# Patient Record
Sex: Female | Born: 1980 | Race: White | Hispanic: No | Marital: Married | Smoking: Never smoker
Health system: Southern US, Community
[De-identification: ages and names within clinical notes are randomized; demographics above are authoritative.]

## PROBLEM LIST (undated history)

## (undated) DIAGNOSIS — I10 Essential (primary) hypertension: Secondary | ICD-10-CM

## (undated) DIAGNOSIS — M1990 Unspecified osteoarthritis, unspecified site: Secondary | ICD-10-CM

## (undated) DIAGNOSIS — S058X1A Other injuries of right eye and orbit, initial encounter: Secondary | ICD-10-CM

## (undated) DIAGNOSIS — C801 Malignant (primary) neoplasm, unspecified: Secondary | ICD-10-CM

## (undated) DIAGNOSIS — E119 Type 2 diabetes mellitus without complications: Secondary | ICD-10-CM

## (undated) DIAGNOSIS — E785 Hyperlipidemia, unspecified: Secondary | ICD-10-CM

## (undated) DIAGNOSIS — R7303 Prediabetes: Secondary | ICD-10-CM

## (undated) HISTORY — PX: TONSILLECTOMY: SUR1361

## (undated) HISTORY — DX: Essential (primary) hypertension: I10

## (undated) HISTORY — DX: Type 2 diabetes mellitus without complications: E11.9

## (undated) HISTORY — PX: DILATION AND CURETTAGE OF UTERUS: SHX78

## (undated) HISTORY — PX: WISDOM TOOTH EXTRACTION: SHX21

## (undated) HISTORY — DX: Hyperlipidemia, unspecified: E78.5

---

## 2002-05-31 ENCOUNTER — Other Ambulatory Visit: Admission: RE | Admit: 2002-05-31 | Discharge: 2002-05-31 | Payer: Self-pay | Admitting: Obstetrics and Gynecology

## 2003-08-17 ENCOUNTER — Observation Stay (HOSPITAL_COMMUNITY): Admission: EM | Admit: 2003-08-17 | Discharge: 2003-08-18 | Payer: Self-pay | Admitting: *Deleted

## 2003-08-22 ENCOUNTER — Encounter: Admission: RE | Admit: 2003-08-22 | Discharge: 2003-08-22 | Payer: Self-pay | Admitting: Family Medicine

## 2006-07-10 ENCOUNTER — Inpatient Hospital Stay (HOSPITAL_COMMUNITY): Admission: AD | Admit: 2006-07-10 | Discharge: 2006-07-10 | Payer: Self-pay | Admitting: Obstetrics & Gynecology

## 2006-07-12 ENCOUNTER — Inpatient Hospital Stay (HOSPITAL_COMMUNITY): Admission: AD | Admit: 2006-07-12 | Discharge: 2006-07-12 | Payer: Self-pay | Admitting: Gynecology

## 2006-07-19 ENCOUNTER — Inpatient Hospital Stay (HOSPITAL_COMMUNITY): Admission: AD | Admit: 2006-07-19 | Discharge: 2006-07-19 | Payer: Self-pay | Admitting: Obstetrics & Gynecology

## 2008-05-17 IMAGING — US US OB COMP LESS 14 WK
1 series · 14 of 28 positions shown · non-contrast
Comparison: none

CLINICAL DATA: 7 weeks, spotting.  
 OBSTETRICAL ULTRASOUND <14 WKS AND TRANSVAGINAL OB US:
TECHNIQUE: Both transabdominal and transvaginal ultrasound examinations were performed for complete evaluation of the gestation as well as the maternal uterus, adnexal regions, and pelvic cul-de-sac.

[Series 1: us ob comp less 14 wks · 14 of 29 slices shown]
[im 2/29]
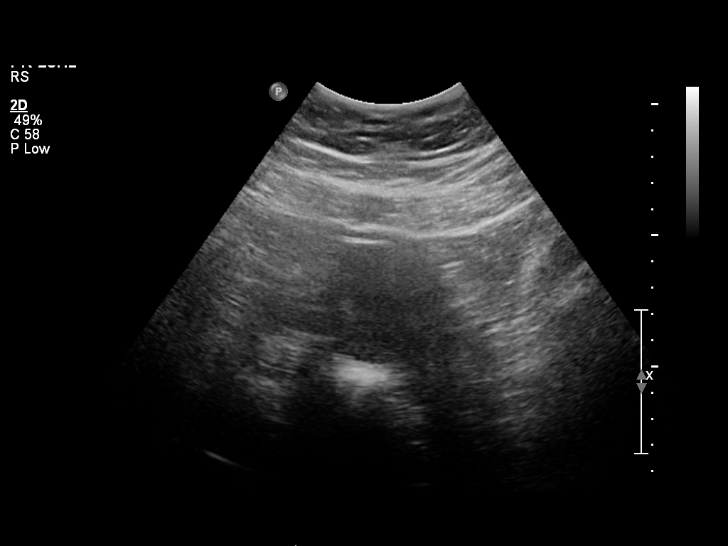
[im 4/29]
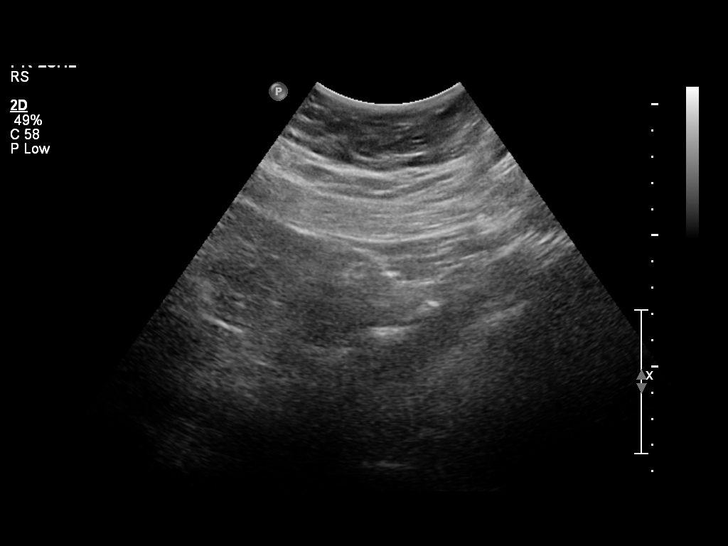
[im 6/29]
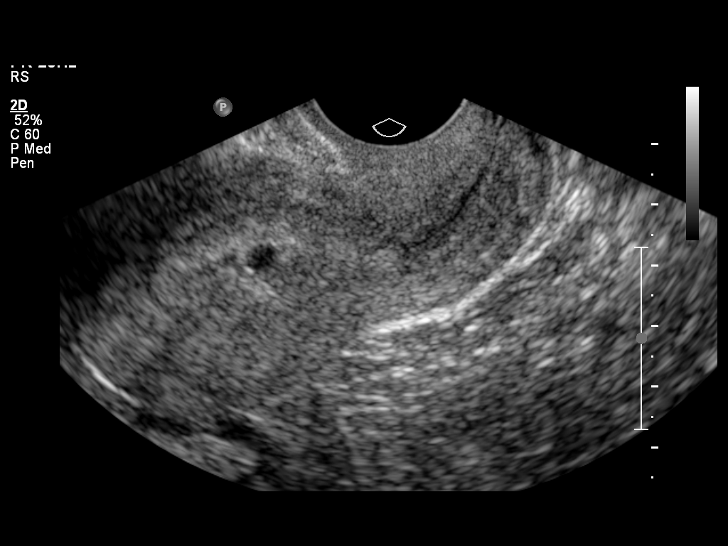
[im 8/29]
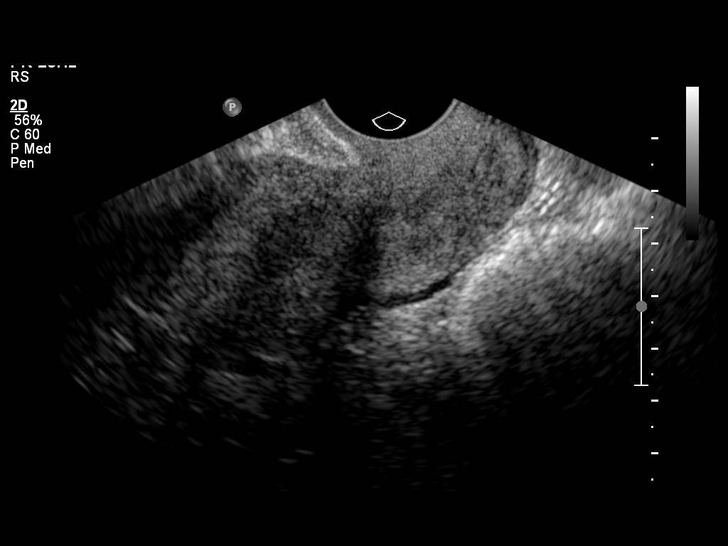
[im 10/29]
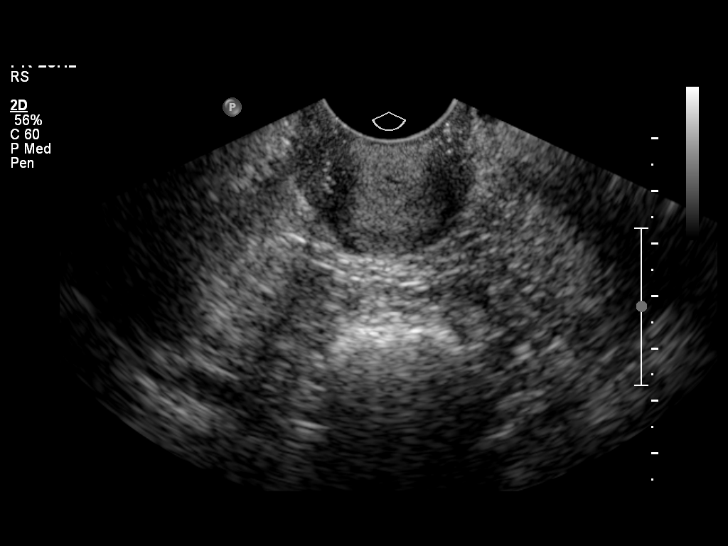
[im 12/29]
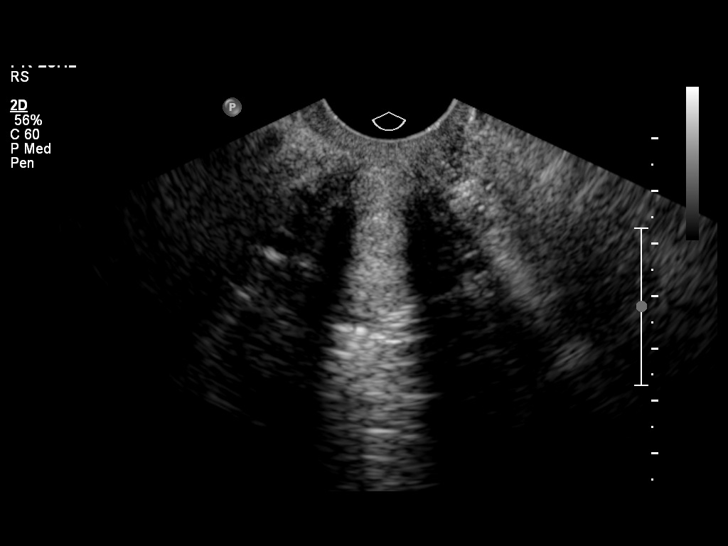
[im 14/29]
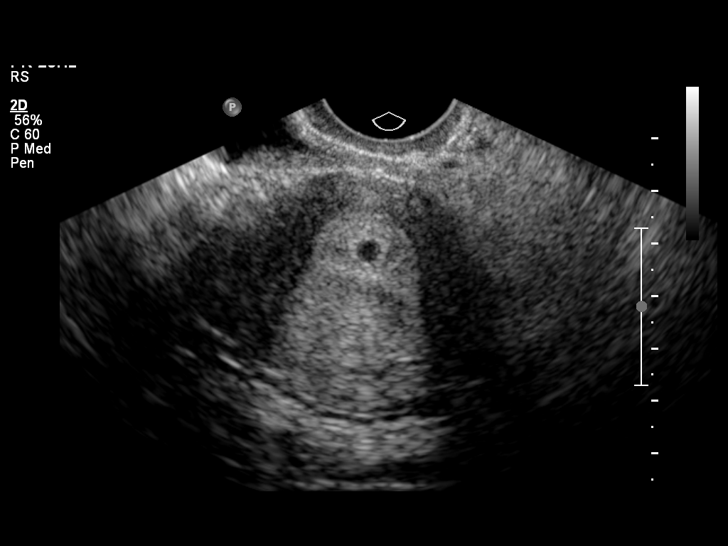
[im 16/29]
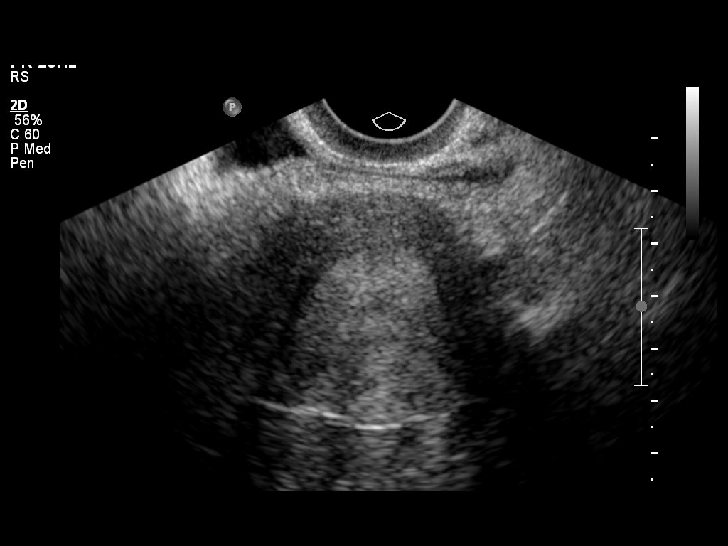
[im 18/29]
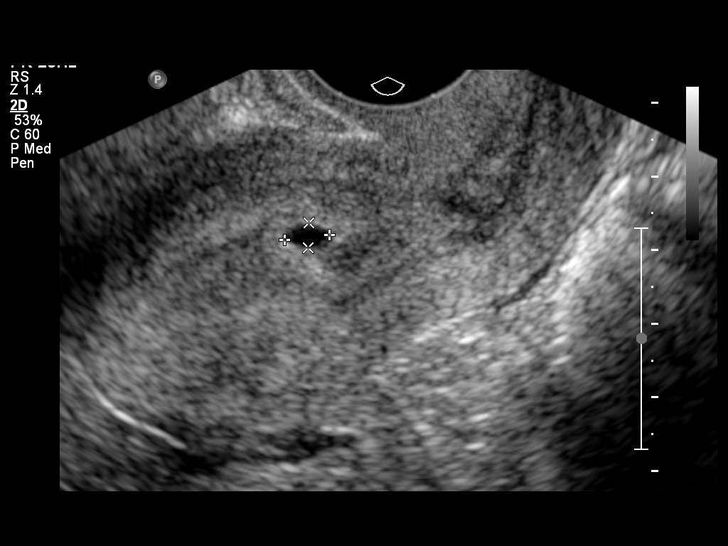
[im 20/29]
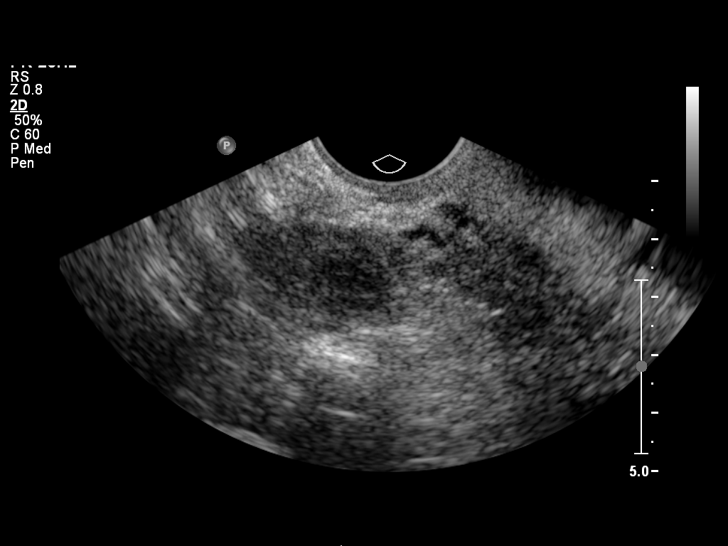
[im 22/29]
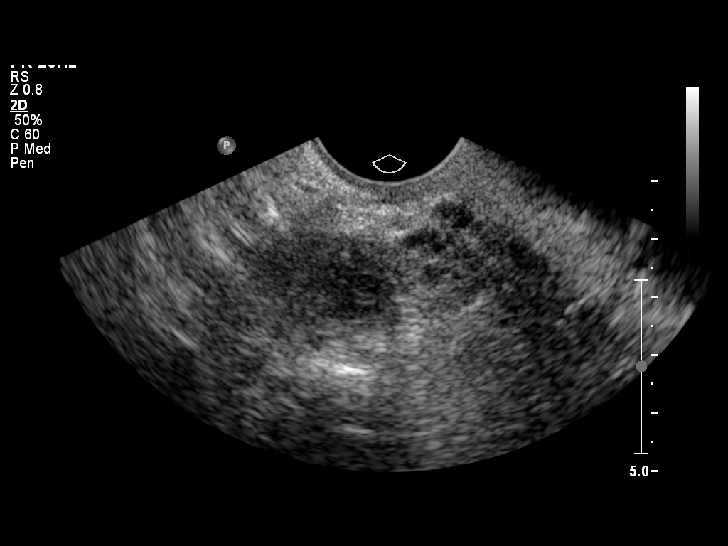
[im 24/29]
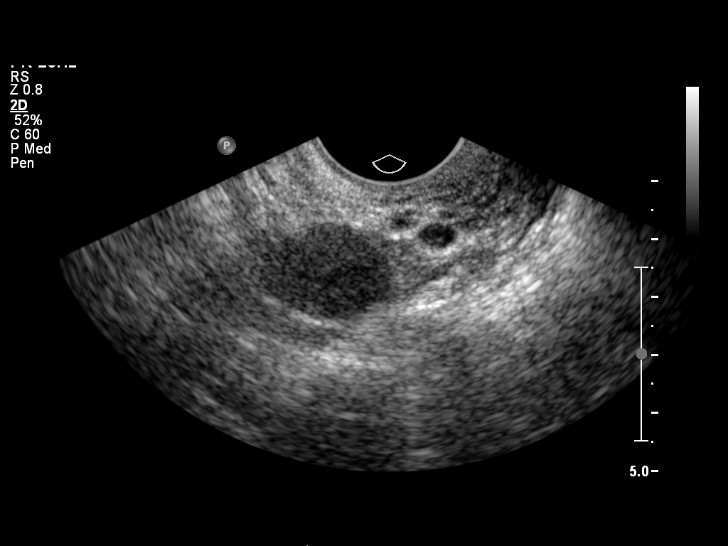
[im 26/29]
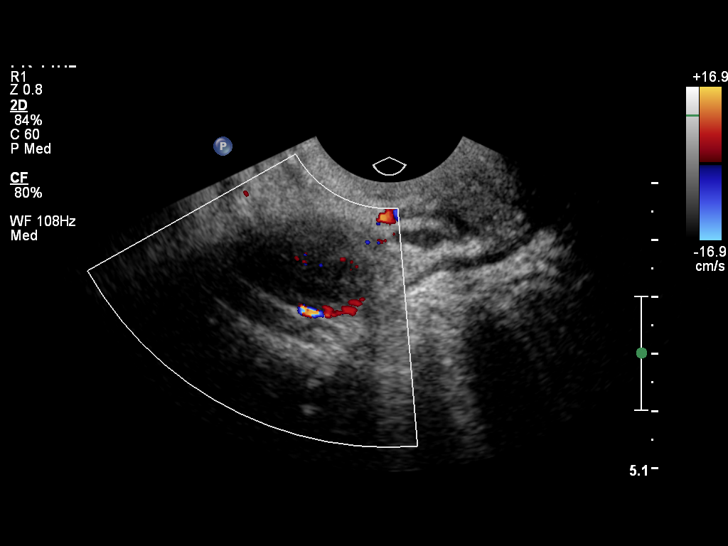
[im 29/29]
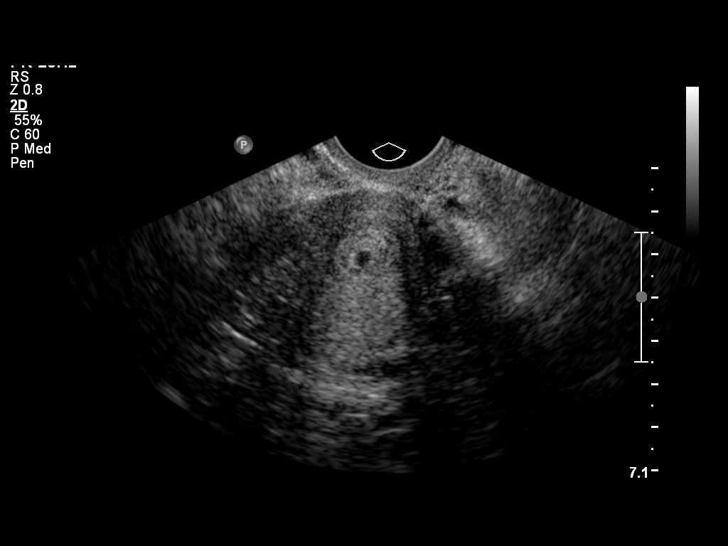

[14 of 28 positions shown; findings below may reference images not displayed]

FINDINGS: Single intrauterine gestational sac with mean sac diameter of 0.48 cm corresponding to 5-week gestation.  No yolk sac or embryo is detected at this time.  No evidence of cardiac activity.  Right ovarian corpus luteum cyst.  Left ovary cannot be visualized.  No free pelvic fluid.
IMPRESSION: Single 5-week intrauterine gestational sac.  No evidence of fetal pole or yolk sac at this time.  Consider follow-up ultrasound pending clinical setting.

## 2010-02-08 ENCOUNTER — Encounter: Payer: Self-pay | Admitting: Obstetrics & Gynecology

## 2010-06-05 NOTE — H&P (Signed)
NAME:  Valerie Aguilar, Valerie Aguilar                     ACCOUNT NO.:  [id number]   MEDICAL RECORD NO.:  [id number]                   PATIENT TYPE:  EMS   LOCATION:  [facility]                                 FACILITY:  [facility]   PHYSICIAN:  Leighton Roach McDiarmid, M.D.             DATE OF BIRTH:  16-Apr-1980   DATE OF ADMISSION:  08/17/2003  DATE OF DISCHARGE:                                HISTORY & PHYSICAL   PRIMARY CARE PHYSICIAN:  [facility].   CHIEF COMPLAINT:  Sore throat and shortness of breath.   HISTORY OF PRESENT ILLNESS:  The patient is a 30 year old white female who  is a patient of [facility] who developed a rash last Friday which  was causing her some significant itching at which time she went to see her  PCP who gave her a steroids shot.  She went home over the weekend,  unfortunately developed a sore throat, returned to the clinic at which time  she was given Augmentin XR 1000 mg to take two tabs b.i.d.  On Wednesday,  she was not feeling better and her throat was still bothering her.  Her  antibiotics were changed to Avelox and she was given another steroid shot  for swelling tonsils.  At that time, her PCP also contacted an ENT physician  who recommended putting her on a tapered dose of methylprednisolone.  The  Avelox unfortunately did not work.  The patient began to feel worse on  Thursday, so she restarted the Augmentin and was taking the oral  methylprednisolone inconsistently as it caused her to have some perceived  tachycardia and upset stomach.  Over the course of the next couple of days  the patient had difficulty sleeping and also had some mild shortness of  breath, developed a temperature of 101 on Friday.  She took ibuprofen,  however, and the fever was resolved.  This morning, the patient has become  increasingly fatigued and increasingly more short of breath, came to the  emergency department at [facility] for evaluation.  In the emergency  department, the  patient was again given her other dose of steroids, 20 mg of  dexamethasone, one dose of clindamycin, as well as some Nubain for pain.  Family Practice Team was called to evaluate the patient as there is some  question as to how compromised her airway was.   PAST MEDICAL HISTORY:  None.   PAST SURGICAL HISTORY:  The patient had her wisdom teeth removed  approximately four years ago.   FAMILY HISTORY:  The patient has a father with hypertension, grandmother  with diabetes as well as hypertension, and a grandfather who is still living  with coronary artery disease.  Otherwise, there is no significant family  history of cancer or any other medical illnesses.   SOCIAL HISTORY:  The patient lives at home with her mother and father,  brothers, and one cousin.  She denies use of any illicit drugs,  ETOH use, or  any tobacco use, or any recent sick contacts.   MEDICATIONS:  The patient is on no long-term medications.   ALLERGIES:  No known drug allergies.   REVIEW OF SYSTEMS:  The patient's review of systems is as per HPI.  Significant for decreased sleep and fatigue, sore throat, positive shortness  of breath, positive low back pain and leg pain.  Otherwise, review of  systems is negative.   PHYSICAL EXAMINATION:  VITAL SIGNS:  Temperature 98.1, blood pressure  127/64, heart rate 110, respiratory rate 22, 98% on room air.  GENERAL:  The patient is alert and oriented x4, in no acute distress.  She  does have some perceivable difficulty speaking, is able to articulate her  words without too much difficulty.  HEENT:  3+ tonsillar swelling with erythema and a whitish-green exudate.  Her tympanic membranes were clear.  Mucous membranes are moist.  Extraocular  movements were intact.  Pupils equal, round, reactive to light.  NECK:  Supple, but with positive anterior cervical lymphadenopathy.  CHEST:  Clear to auscultation bilaterally without any wheezes, rhonchi, or  rales.  CARDIOVASCULAR:   Positive S1 and S2, no murmurs, rubs, or gallops.  She has  no JVD and 2+ distal pulses.  ABDOMEN:  Soft, nontender, nondistended, positive bowel sounds in all four  quadrants, although she is obese and there is no palpable  hepatosplenomegaly.  NEUROLOGIC:  The patient has normal cranial nerves II-XII, and no focal  deficits.  EXTREMITIES:  No cyanosis, clubbing, or edema.  MUSCULOSKELETAL:  5+ muscle strength bilaterally in both upper and lower  extremities.  SKIN:  A macular papular rash on bilateral upper extremities as well as her  upper legs.  It is non-blanching, and does not itch at this time.   LABORATORY DATA:  Sodium 134, potassium 3.8, chloride 102, BUN 6, blood  glucose 112.  PH was 7.48, PCO2 33.2.  White blood cell count was 12,  hemoglobin 13.5, hematocrit 41.2, platelets 243.  Rapid Strep test in the  emergency department was negative.  Monospot test, however, was positive.   ASSESSMENT AND PLAN:  This is a 30 year old white female with no significant  past medical history with a one week history of mono presenting originally  with a macular papular rash with significant itching, now with worsening  tonsillar swelling and increasing difficulty breathing.   PLAN:  The patient received a steroid injection of 20 mg of dexamethasone in  the emergency department.  We will continue her on oral prednisone 60 mg  p.o. daily for at least the next seven days.  We will also scan her neck  with intravenous contrast to evaluate her airway.  Once the results of this  scan are obtained, we will consult ENT to see if our management will need to  be changed in any way.      Hansel Feinstein, M.D.                   Leighton Roach McDiarmid, M.D.    PM/MEDQ  D:  08/17/2003  T:  08/17/2003  Job:  [id number]

## 2010-11-03 LAB — HCG, QUANTITATIVE, PREGNANCY: hCG, Beta Chain, Quant, S: 6 — ABNORMAL HIGH

## 2010-11-04 LAB — URINALYSIS, ROUTINE W REFLEX MICROSCOPIC
Glucose, UA: NEGATIVE
Nitrite: NEGATIVE
Protein, ur: NEGATIVE
Urobilinogen, UA: 0.2
pH: 5.5

## 2010-11-04 LAB — HCG, QUANTITATIVE, PREGNANCY: hCG, Beta Chain, Quant, S: 1086 — ABNORMAL HIGH

## 2010-11-04 LAB — ABO/RH: ABO/RH(D): B POS

## 2010-11-04 LAB — POCT PREGNANCY, URINE: Operator id: 239321

## 2010-11-04 LAB — URINE MICROSCOPIC-ADD ON

## 2010-11-04 LAB — CBC
MCHC: 33.3
RBC: 4.93
WBC: 8.6

## 2010-11-04 LAB — WET PREP, GENITAL
Trich, Wet Prep: NONE SEEN
Yeast Wet Prep HPF POC: NONE SEEN

## 2010-11-04 LAB — GC/CHLAMYDIA PROBE AMP, GENITAL: GC Probe Amp, Genital: NEGATIVE

## 2013-04-23 ENCOUNTER — Encounter: Payer: Self-pay | Admitting: Emergency Medicine

## 2013-04-23 ENCOUNTER — Emergency Department (INDEPENDENT_AMBULATORY_CARE_PROVIDER_SITE_OTHER)
Admission: EM | Admit: 2013-04-23 | Discharge: 2013-04-23 | Disposition: A | Discharge status: Home / Self Care | Payer: 59 | Source: Home / Self Care | Attending: Family Medicine | Admitting: Family Medicine

## 2013-04-23 DIAGNOSIS — J029 Acute pharyngitis, unspecified: Secondary | ICD-10-CM

## 2013-04-23 DIAGNOSIS — J069 Acute upper respiratory infection, unspecified: Secondary | ICD-10-CM

## 2013-04-23 LAB — POCT RAPID STREP A (OFFICE): RAPID STREP A SCREEN: NEGATIVE

## 2013-04-23 MED ORDER — BENZONATATE 200 MG PO CAPS: 200.0000 mg | ORAL_CAPSULE | Freq: Every day | ORAL | Status: DC

## 2013-04-23 MED ORDER — AZITHROMYCIN 250 MG PO TABS: ORAL_TABLET | ORAL | Status: DC

## 2013-04-23 NOTE — Discharge Instructions (Signed)
Take plain Mucinex (1200 mg guaifenesin) twice daily for cough and congestion.  Increase fluid intake, rest. May use Afrin nasal spray (or generic oxymetazoline) twice daily for about 5 days.  Also recommend using saline nasal spray several times daily and saline nasal irrigation (AYR is a common brand) Try warm salt water gargles for sore throat.  Stop all antihistamines for now, and other non-prescription cough/cold preparations. Use 1% hydrocortisone cream for rash. Begin Azithromycin if not improving about 5 days or if persistent fever develops  Check blood pressure at home and follow-up with family doctor if it remains elevated. Follow-up with family doctor if not improving10 days.    Salt Water Gargle This solution will help make your mouth and throat feel better. HOME CARE INSTRUCTIONS   Mix 1 teaspoon of salt in 8 ounces of warm water.  Gargle with this solution as much or often as you need or as directed. Swish and gargle gently if you have any sores or wounds in your mouth.  Do not swallow this mixture. Document Released: 10/09/2003 Document Revised: 03/29/2011 Document Reviewed: 03/01/2008 [facility] Patient Information 2014 [place].

## 2013-04-23 NOTE — ED Notes (Signed)
Congestion, sneezing, Productive cough with green sputum, hoarseness x 6 days

## 2013-04-23 NOTE — ED Provider Notes (Signed)
CSN: [id number]     Arrival date & time 04/23/13  1409 History   First MD Initiated Contact with Patient 04/23/13 1511     Chief Complaint  Patient presents with  . URI      HPI Comments: Six days ago patient developed fatigue, headache, and sinus congestion.  The next day she developed a cough that is semi-productive.  Four days ago she developed a pruritic rash on her neck. Yesterday she developed a sore throat.  She has not had a fever but feels cold.   She states that her blood pressure is normally not elevated.                                                                                                                                                                                                                                                                               The history is provided by the patient.    History reviewed. No pertinent past medical history. Past Surgical History  Procedure Laterality Date  . Tonsillectomy     Family History  Problem Relation Age of Onset  . COPD Mother   . Hypertension Father   . Diabetes Father    History  Substance Use Topics  . Smoking status: Never Smoker   . Smokeless tobacco: Not on file  . Alcohol Use: No   OB History   Grav Para Term Preterm Abortions TAB SAB Ect Mult Living                 Review of Systems + sore throat + cough No pleuritic pain ? wheezing + nasal congestion + post-nasal drainage ? sinus pain/pressure No itchy/red eyes ? earache No hemoptysis No SOB No fever/chills but has felt cold No nausea No vomiting No abdominal pain No diarrhea No urinary symptoms No skin rash + fatigue No myalgias + headache Used OTC meds without relief  Allergies  Review of patient's allergies indicates no known allergies.  Home Medications   Current Outpatient Rx  Name  Route  Sig  Dispense  Refill  . cetirizine (ZYRTEC) 10 MG tablet   Oral   Take 10 mg by mouth daily.         Marland Kitchen  azithromycin  (ZITHROMAX Z-PAK) 250 MG tablet      Take 2 tabs today; then begin one tab once daily for 4 more days. (Rx void after 05/01/13)   6 each   0   . benzonatate (TESSALON) 200 MG capsule   Oral   Take 1 capsule (200 mg total) by mouth at bedtime. Take as needed for cough   12 capsule   0    BP 172/109  Pulse 95  Temp(Src) 97.8 F (36.6 C) (Oral)  Ht 5\' 10"  (1.778 m)  Wt 394 lb (178.717 kg)  BMI 56.53 kg/m2  SpO2 98% Physical Exam Nursing notes and Vital Signs reviewed. Appearance:  Patient appears stated age, and in no acute distress.  Patient is obese (BMI 56.5) Eyes:  Pupils are equal, round, and reactive to light and accomodation.  Extraocular movement is intact.  Conjunctivae are not inflamed  Ears:  Canals normal.  Tympanic membranes normal.  Nose:  Mildly congested turbinates.  No sinus tenderness.   Pharynx:  Mildly erythematous Neck:  Supple.   Nontender enlarged posterior nodes are palpated bilaterally  Lungs:  Clear to auscultation.  Breath sounds are equal.  Heart:  Regular rate and rhythm without murmurs, rubs, or gallops.  Abdomen:  Nontender without masses or hepatosplenomegaly.  Bowel sounds are present.  No CVA or flank tenderness.  Extremities:  No edema.  No calf tenderness Skin:  On left neck there is an eruption of small 1 to 2 mm dia erythematous macules.                                                                                                                                                                              ED Course  Procedures  none    Labs Reviewed  STREP A DNA PROBE  POCT RAPID STREP A (OFFICE) negative        MDM   1. Acute pharyngitis   2. Acute upper respiratory infections of unspecified site  Note elevated blood pressure today.   There is no evidence of bacterial infection today.  Prescription written for Benzonatate [facility]) to take at bedtime for night-time cough.  Throat culture pending Take plain Mucinex (1200 mg  guaifenesin) twice daily for cough and congestion.  Increase fluid intake, rest. May use Afrin nasal spray (or generic oxymetazoline) twice daily for about 5 days.  Also recommend using saline nasal spray several times daily and saline nasal irrigation (AYR is a common brand) Try warm salt water gargles for sore throat.  Stop all antihistamines for now, and other non-prescription cough/cold preparations. Use 1% hydrocortisone cream for rash (probably viral exanthem) Begin Azithromycin if not improving about 5 days or if  persistent fever develops (Given a prescription to hold, with an expiration date)  Check blood pressure at home and follow-up with family doctor if it remains elevated. Follow-up with family doctor if not improving10 days.     Kandra Nicolas, MD 04/23/13 [phone]

## 2013-04-24 LAB — STREP A DNA PROBE: GASP: NEGATIVE

## 2013-04-26 ENCOUNTER — Telehealth: Payer: Self-pay | Admitting: *Deleted

## 2013-04-28 ENCOUNTER — Encounter (HOSPITAL_COMMUNITY): Payer: Self-pay | Admitting: Emergency Medicine

## 2013-04-28 ENCOUNTER — Emergency Department (HOSPITAL_COMMUNITY): Payer: 59

## 2013-04-28 ENCOUNTER — Emergency Department (HOSPITAL_COMMUNITY)
Admission: EM | Admit: 2013-04-28 | Discharge: 2013-04-29 | Disposition: A | Discharge status: Home / Self Care | Payer: 59 | Attending: Emergency Medicine | Admitting: Emergency Medicine

## 2013-04-28 DIAGNOSIS — R51 Headache: Secondary | ICD-10-CM | POA: Insufficient documentation

## 2013-04-28 DIAGNOSIS — I10 Essential (primary) hypertension: Secondary | ICD-10-CM | POA: Insufficient documentation

## 2013-04-28 DIAGNOSIS — R519 Headache, unspecified: Secondary | ICD-10-CM

## 2013-04-28 DIAGNOSIS — H538 Other visual disturbances: Secondary | ICD-10-CM | POA: Insufficient documentation

## 2013-04-28 DIAGNOSIS — R079 Chest pain, unspecified: Secondary | ICD-10-CM | POA: Insufficient documentation

## 2013-04-28 HISTORY — DX: Morbid (severe) obesity due to excess calories: E66.01

## 2013-04-28 LAB — CBC WITH DIFFERENTIAL/PLATELET
Basophils Absolute: 0 10*3/uL (ref 0.0–0.1)
Basophils Relative: 0 % (ref 0–1)
Eosinophils Absolute: 0.1 10*3/uL (ref 0.0–0.7)
Eosinophils Relative: 0 % (ref 0–5)
HCT: 44.2 % (ref 36.0–46.0)
HEMOGLOBIN: 14.9 g/dL (ref 12.0–15.0)
LYMPHS ABS: 3.2 10*3/uL (ref 0.7–4.0)
Lymphocytes Relative: 28 % (ref 12–46)
MCH: 28.5 pg (ref 26.0–34.0)
MCHC: 33.7 g/dL (ref 30.0–36.0)
MCV: 84.7 fL (ref 78.0–100.0)
MONOS PCT: 5 % (ref 3–12)
Monocytes Absolute: 0.6 10*3/uL (ref 0.1–1.0)
NEUTROS ABS: 7.7 10*3/uL (ref 1.7–7.7)
NEUTROS PCT: 67 % (ref 43–77)
Platelets: 202 10*3/uL (ref 150–400)
RBC: 5.22 MIL/uL — AB (ref 3.87–5.11)
RDW: 13.7 % (ref 11.5–15.5)
WBC: 11.6 10*3/uL — AB (ref 4.0–10.5)

## 2013-04-28 LAB — BASIC METABOLIC PANEL
BUN: 9 mg/dL (ref 6–23)
CHLORIDE: 102 meq/L (ref 96–112)
CO2: 21 mEq/L (ref 19–32)
Calcium: 9.5 mg/dL (ref 8.4–10.5)
Creatinine, Ser: 0.66 mg/dL (ref 0.50–1.10)
GFR calc non Af Amer: >90 mL/min (ref >90)
GLUCOSE: 102 mg/dL — AB (ref 70–99)
POTASSIUM: 4.1 meq/L (ref 3.7–5.3)
SODIUM: 139 meq/L (ref 137–147)

## 2013-04-28 LAB — I-STAT TROPONIN, ED: Troponin i, poc: 0 ng/mL (ref 0.00–0.08)

## 2013-04-28 MED ORDER — KETOROLAC TROMETHAMINE 60 MG/2ML IM SOLN
60.0000 mg | Freq: Once | INTRAMUSCULAR | Status: AC
  Administered 2013-04-28: 60 mg via INTRAMUSCULAR
  Filled 2013-04-28: qty 2

## 2013-04-28 MED ORDER — METOCLOPRAMIDE HCL 5 MG/ML IJ SOLN
10.0000 mg | Freq: Once | INTRAMUSCULAR | Status: AC
  Administered 2013-04-28: 10 mg via INTRAMUSCULAR
  Filled 2013-04-28: qty 2

## 2013-04-28 MED ORDER — HYDROCHLOROTHIAZIDE 25 MG PO TABS
25.0000 mg | ORAL_TABLET | Freq: Every day | ORAL | Status: DC
  Filled 2013-04-28: qty 1

## 2013-04-28 NOTE — ED Notes (Signed)
Pt not in room, contacted nurse first to find pt.

## 2013-04-28 NOTE — ED Notes (Signed)
Pt reports going to pcp on Tuesday for cold symptoms, was told her bp was high and to keep an eye on it. Pt had onset of headache today and checked her bp, it was as high as 197/105 pta. At triage, bp was 163/105. No acute distress noted at triage.

## 2013-04-28 NOTE — ED Notes (Signed)
Pt c/o increased BP, sts she was seen a few days ago at PCP and told it was elevated. Pt reports she has been checking it at home and still noticed it was increased. Pt c/o intermittent HA X 5 days, today HA was worse than the past week. Pt sts that today the right side of her jaw started to hurt, this is the first time this has happened to her. Nad, skin warm and dry, resp e/u.

## 2013-04-28 NOTE — ED Provider Notes (Signed)
CSN: [id number]     Arrival date & time 04/28/13  1711 History   First MD Initiated Contact with Patient 04/28/13 1820     Chief Complaint  Patient presents with  . Hypertension  . Headache     (Consider location/radiation/quality/duration/timing/severity/associated sxs/prior Treatment) The history is provided by the patient. No language interpreter was used.  Valerie Aguilar is a 33 y/o F with PMHx of obesity presenting to the ED with episode of elevated blood pressure and headache that started today. Patient reported that she was seen and assessed at Urgent Care on Tuesday for cold-like symptoms. Reported that she was discharged with mucinex and z-pack (patient did not take the z-pack). Reported that the physician she saw at [facility] reported that her blood pressure was elevated and to be monitor the blood pressure. Patient reported that she started to have a headache intermittently over the past couple of days with the headache worse today. Stated that when she took her blood pressure her blood pressure was 197/105. Reported that the headache is localized to the right side of the head described as a throbbing sensation with radiation to her right side of her neck and jaw. Patient reported that she has intermittent blurred vision associated with this. Stated that her pain has gotten progressively worse with time. Reported that the pain improves with sitting and doing paperwork. Denied fever, chills, nausea, vomiting, diarrhea, abdominal pain, chest pain, shortness of breath, difficulty breathing, difficulty swallowing, weakness, tingling, numbness, sudden loss of vision. PCP none  Past Medical History  Diagnosis Date  . Obesity, morbid    Past Surgical History  Procedure Laterality Date  . Tonsillectomy     Family History  Problem Relation Age of Onset  . COPD Mother   . Hypertension Father   . Diabetes Father    History  Substance Use Topics  . Smoking status: Never Smoker   . Smokeless  tobacco: Not on file  . Alcohol Use: No   OB History   Grav Para Term Preterm Abortions TAB SAB Ect Mult Living                 Review of Systems  Constitutional: Negative for fever and chills.  HENT: Negative for trouble swallowing.   Eyes: Positive for visual disturbance.  Respiratory: Negative for chest tightness and shortness of breath.   Cardiovascular: Positive for chest pain.  Gastrointestinal: Negative for nausea, vomiting, abdominal pain and diarrhea.  Neurological: Positive for headaches. Negative for dizziness, weakness and numbness.  Psychiatric/Behavioral: Negative for confusion.  All other systems reviewed and are negative.     Allergies  Review of patient's allergies indicates no known allergies.  Home Medications  No current outpatient prescriptions on file. BP 139/74  Pulse 83  Temp(Src) 97.6 F (36.4 C) (Oral)  Resp 25  SpO2 94%  LMP 04/04/2013 Physical Exam  Nursing note and vitals reviewed. Constitutional: She is oriented to person, place, and time. She appears well-developed and well-nourished. No distress.  HENT:  Head: Normocephalic and atraumatic.  Mouth/Throat: Oropharynx is clear and moist. No oropharyngeal exudate.  Negative trismus  Eyes: Conjunctivae and EOM are normal. Pupils are equal, round, and reactive to light. Right eye exhibits no discharge. Left eye exhibits no discharge.  Negative nystagmus Visual fields grossly intact  Neck: Normal range of motion. Neck supple. No tracheal deviation present.  Cardiovascular: Normal rate, regular rhythm and normal heart sounds.  Exam reveals no friction rub.   No murmur heard. Pulses:  Radial pulses are 2+ on the right side, and 2+ on the left side.       Dorsalis pedis pulses are 2+ on the right side, and 2+ on the left side.  Pulmonary/Chest: Effort normal and breath sounds normal. No respiratory distress. She has no wheezes. She has no rales. She exhibits no tenderness.  Abdominal:   Obese   Musculoskeletal: Normal range of motion.  Full ROM to upper and lower extremities without difficulty noted, negative ataxia noted.  Lymphadenopathy:    She has no cervical adenopathy.  Neurological: She is alert and oriented to person, place, and time. No cranial nerve deficit. She exhibits normal muscle tone. Coordination normal.  Cranial nerves III-XII grossly intact Strength 5+/5+ to upper and lower extremities bilaterally with resistance applied, equal distribution noted Sensation intact to upper and lower extremities with differentiation to sharp and dull touch  Negative arm drift Fine motor skills intact Heel to knee down shin normal bilaterally Negative facial drooping  Gait proper-negative sway or disbalance noted, negative limp or step-offs Negative slurred speech   Skin: Skin is warm and dry. No rash noted. She is not diaphoretic. No erythema.  Psychiatric: She has a normal mood and affect. Her behavior is normal. Thought content normal.    ED Course  Procedures (including critical care time)  11:20 PM Patient seen and re-assessed by this provider. Patient reported that headache has improved. Denied nausea. Reported that she feels a lot better. Discussed labs and imaging in great detail with patient. Discussed plan for discharge-patient and family at bedside agreed to plan of care.  Results for orders placed during the hospital encounter of 04/28/13  CBC WITH DIFFERENTIAL      Result Value Ref Range   WBC 11.6 (*) 4.0 - 10.5 K/uL   RBC 5.22 (*) 3.87 - 5.11 MIL/uL   Hemoglobin 14.9  12.0 - 15.0 g/dL   HCT 44.2  36.0 - 46.0 %   MCV 84.7  78.0 - 100.0 fL   MCH 28.5  26.0 - 34.0 pg   MCHC 33.7  30.0 - 36.0 g/dL   RDW 13.7  11.5 - 15.5 %   Platelets 202  150 - 400 K/uL   Neutrophils Relative % 67  43 - 77 %   Neutro Abs 7.7  1.7 - 7.7 K/uL   Lymphocytes Relative 28  12 - 46 %   Lymphs Abs 3.2  0.7 - 4.0 K/uL   Monocytes Relative 5  3 - 12 %   Monocytes Absolute  0.6  0.1 - 1.0 K/uL   Eosinophils Relative 0  0 - 5 %   Eosinophils Absolute 0.1  0.0 - 0.7 K/uL   Basophils Relative 0  0 - 1 %   Basophils Absolute 0.0  0.0 - 0.1 K/uL  BASIC METABOLIC PANEL      Result Value Ref Range   Sodium 139  137 - 147 mEq/L   Potassium 4.1  3.7 - 5.3 mEq/L   Chloride 102  96 - 112 mEq/L   CO2 21  19 - 32 mEq/L   Glucose, Bld 102 (*) 70 - 99 mg/dL   BUN 9  6 - 23 mg/dL   Creatinine, Ser 0.66  0.50 - 1.10 mg/dL   Calcium 9.5  8.4 - 10.5 mg/dL   GFR calc non Af Amer >90  >90 mL/min   GFR calc Af Amer >90  >90 mL/min  Randolm Idol, ED      Result Value  Ref Range   Troponin i, poc 0.00  0.00 - 0.08 ng/mL   Comment 3             Labs Review Labs Reviewed  CBC WITH DIFFERENTIAL - Abnormal; Notable for the following:    WBC 11.6 (*)    RBC 5.22 (*)    All other components within normal limits  BASIC METABOLIC PANEL - Abnormal; Notable for the following:    Glucose, Bld 102 (*)    All other components within normal limits  I-STAT TROPOININ, ED  I-STAT TROPOININ, ED   Imaging Review Ct Head Wo Contrast  04/28/2013   CLINICAL DATA:  Severe headache, elevated blood pressure  EXAM: CT HEAD WITHOUT CONTRAST  TECHNIQUE: Contiguous axial images were obtained from the base of the skull through the vertex without intravenous contrast.  COMPARISON:  None.  FINDINGS: Near complete opacification of the right maxillary sinus. The remainder of the visualized sinuses are clear. No evidence of hemorrhage or extra-axial fluid. No vascular territory infarct, mass, or hydrocephalus.  IMPRESSION: Significant inflammatory change right maxillary sinus. Otherwise negative.   Electronically Signed   By: Skipper Cliche M.D.   On: 04/28/2013 21:04     EKG Interpretation None      Date: 04/28/2013  Rate: 95  Rhythm: normal sinus rhythm  QRS Axis: normal  Intervals: normal  ST/T Wave abnormalities: normal  Conduction Disutrbances:none  Narrative Interpretation:   Old  EKG Reviewed: unchanged EKG analyzed reviewed by this provider and attending physician   MDM   Final diagnoses:  Headache  HTN (hypertension)   Medications  ketorolac (TORADOL) injection 60 mg (60 mg Intramuscular Given 04/28/13 2142)  metoCLOPramide (REGLAN) injection 10 mg (10 mg Intramuscular Given 04/28/13 2142)   Filed Vitals:   04/29/13 0030 04/29/13 0045 04/29/13 0100 04/29/13 0115  BP: 157/74 158/85 153/74 139/74  Pulse: 87 89 87 83  Temp:      TempSrc:      Resp:      SpO2: 95% 97% 94% 94%    Patient presenting to the ED with headache and elevated blood pressure. Reported that she has been having a headache intermittently for the past couple of days with headache worsening today - reported that when she took her blood pressure the reading was 197/105. Patient reported that she came to the ED. Stated that she's been having intermittent blurred vision. Reported the pain to be a throbbing sensation radiating to the right side of her neck and right jaw. Alert and oriented. GCS 15. Heart rate and rhythm normal. Lungs good auscultation to upper and lower lobes bilaterally. Radial and DP pulses 2+ bilaterally. Cap refill is 3 seconds. Obese. Full range of motion to upper and lower extremities bilaterally without difficulty noted. Strength intact with equal distribution. Negative facial drooping. Negative slurred speech. Cranial nerves grossly intact. Patient able to bring finger to nose bilaterally without difficulty or ataxia. Patient able to bring heel to knee down shin without difficulty. Negative nystagmus, visual fields grossly intact. EKG noted normal sinus rhythm with a heart rate of 95 beats per minute-negative ischemic findings noted. Troponin negative elevation. Second troponin negative elevation. CBC noted mild elevated white blood cell count of 11.6. BMP noted proper kidney function. CT head without contrast negative for acute intracranial abnormalities, significant right  maxillary sinus infection noted. Doubt ICH. Doubt SAH. Doubt posterior vertebral infarct. Suspicion to be headache secondary to nasal congestion and upper respiratory infection the patient has been dealing with  for the past couple of days. Suspicion of elevated blood pressure secondary to pain and headache. Negative findings of end organ damage. Patient is obese and has a poor diet-high blood pressure runs in the family. Cannot rule out possibility of hypertension beginning. Patient's blood pressure monitored while in ED setting-blood pressure controlled. Pain controlled in the ED setting. Negative focal neurological deficits noted - patient able to walk without difficulty or issues, negative sway or poor balance noted. Discussed case with attending physician who agreed to plan of discharge. Patient stable, afebrile. Discharged patient. Discussed with patient to rest and stay hydrated. Discussed with patient to continue to monitor blood pressure. Discussed with patient DASH diet. Discussed with patient to use z-pack regarding inflammation to the right sinus region. Discussed with patient to closely monitor symptoms and if symptoms are to worsen or change to report back to the ED - strict return instructions given.  Patient agreed to plan of care, understood, all questions answered.   Jamse Mead, PA-C 04/29/13 0410

## 2013-04-29 NOTE — ED Provider Notes (Signed)
Medical screening examination/treatment/procedure(s) were performed by non-physician practitioner and as supervising physician I was immediately available for consultation/collaboration.   EKG Interpretation None        Wandra Arthurs, MD 04/29/13 [id number]

## 2013-04-29 NOTE — ED Notes (Signed)
Pt ambulated around pod x 1 with no difficulty.  Gait steady, resps e/u.

## 2013-04-29 NOTE — Discharge Instructions (Signed)
Please call your doctor for a followup appointment within 24-48 hours. When you talk to your doctor please let them know that you were seen in the emergency department and have them acquire all of your records so that they can discuss the findings with you and formulate a treatment plan to fully care for your new and ongoing problems. Please call and set-up an appointment with [facility] to be re-assessed and for blood pressure to be monitored Please continue to monitor blood pressure Can start using z-pack for sinus infection Please rest and stay please continue to monitor symptoms closely and if symptoms are to worsen or change (fever greater than 101, chills, nausea, chest pain, shortness of breath, difficulty breathing, blurred vision, sudden loss of vision, neck pain, neck stiffness, nausea, vomiting, abdominal pain, confusion, ongoing headaches, worsening headaches, worsening of the headache, thunderclap onset of the headache) please report back to the ED immediately   Hypertension As your heart beats, it forces blood through your arteries. This force is your blood pressure. If the pressure is too high, it is called hypertension (HTN) or high blood pressure. HTN is dangerous because you may have it and not know it. High blood pressure may mean that your heart has to work harder to pump blood. Your arteries may be narrow or stiff. The extra work puts you at risk for heart disease, stroke, and other problems.  Blood pressure consists of two numbers, a higher number over a lower, 110/72, for example. It is stated as "110 over 72." The ideal is below 120 for the top number (systolic) and under 80 for the bottom (diastolic). Write down your blood pressure today. You should pay close attention to your blood pressure if you have certain conditions such as:  Heart failure.  Prior heart attack.  Diabetes  Chronic kidney disease.  Prior stroke.  Multiple risk factors for heart disease. To  see if you have HTN, your blood pressure should be measured while you are seated with your arm held at the level of the heart. It should be measured at least twice. A one-time elevated blood pressure reading (especially in the Emergency Department) does not mean that you need treatment. There may be conditions in which the blood pressure is different between your right and left arms. It is important to see your caregiver soon for a recheck. Most people have essential hypertension which means that there is not a specific cause. This type of high blood pressure may be lowered by changing lifestyle factors such as:  Stress.  Smoking.  Lack of exercise.  Excessive weight.  Drug/tobacco/alcohol use.  Eating less salt. Most people do not have symptoms from high blood pressure until it has caused damage to the body. Effective treatment can often prevent, delay or reduce that damage. TREATMENT  When a cause has been identified, treatment for high blood pressure is directed at the cause. There are a large number of medications to treat HTN. These fall into several categories, and your caregiver will help you select the medicines that are best for you. Medications may have side effects. You should review side effects with your caregiver. If your blood pressure stays high after you have made lifestyle changes or started on medicines,   Your medication(s) may need to be changed.  Other problems may need to be addressed.  Be certain you understand your prescriptions, and know how and when to take your medicine.  Be sure to follow up with your caregiver within  the time frame advised (usually within two weeks) to have your blood pressure rechecked and to review your medications.  If you are taking more than one medicine to lower your blood pressure, make sure you know how and at what times they should be taken. Taking two medicines at the same time can result in blood pressure that is too low. SEEK  IMMEDIATE MEDICAL CARE IF:  You develop a severe headache, blurred or changing vision, or confusion.  You have unusual weakness or numbness, or a faint feeling.  You have severe chest or abdominal pain, vomiting, or breathing problems. MAKE SURE YOU:   Understand these instructions.  Will watch your condition.  Will get help right away if you are not doing well or get worse. Document Released: 01/04/2005 Document Revised: 03/29/2011 Document Reviewed: 08/25/2007 [facility] Patient Information 2014 [place].  DASH Diet The DASH diet stands for "Dietary Approaches to Stop Hypertension." It is a healthy eating plan that has been shown to reduce high blood pressure (hypertension) in as little as 14 days, while also possibly providing other significant health benefits. These other health benefits include reducing the risk of breast cancer after menopause and reducing the risk of type 2 diabetes, heart disease, colon cancer, and stroke. Health benefits also include weight loss and slowing kidney failure in patients with chronic kidney disease.  DIET GUIDELINES  Limit salt (sodium). Your diet should contain less than 1500 mg of sodium daily.  Limit refined or processed carbohydrates. Your diet should include mostly whole grains. Desserts and added sugars should be used sparingly.  Include small amounts of heart-healthy fats. These types of fats include nuts, oils, and tub margarine. Limit saturated and trans fats. These fats have been shown to be harmful in the body. CHOOSING FOODS  The following food groups are based on a 2000 calorie diet. See your Registered Dietitian for individual calorie needs. Grains and Grain Products (6 to 8 servings daily)  Eat More Often: Whole-wheat bread, brown rice, whole-grain or wheat pasta, quinoa, popcorn without added fat or salt (air popped).  Eat Less Often: White bread, white pasta, white rice, cornbread. Vegetables (4 to 5 servings daily)  Eat  More Often: Fresh, frozen, and canned vegetables. Vegetables may be raw, steamed, roasted, or grilled with a minimal amount of fat.  Eat Less Often/Avoid: Creamed or fried vegetables. Vegetables in a cheese sauce. Fruit (4 to 5 servings daily)  Eat More Often: All fresh, canned (in natural juice), or frozen fruits. Dried fruits without added sugar. One hundred percent fruit juice ( cup [237 mL] daily).  Eat Less Often: Dried fruits with added sugar. Canned fruit in light or heavy syrup. [employer], Fish, and Poultry (2 servings or less daily. One serving is 3 to 4 oz [85-114 g]).  Eat More Often: Ninety percent or leaner ground beef, tenderloin, sirloin. Round cuts of beef, chicken breast, [place] breast. All fish. Grill, bake, or broil your meat. Nothing should be fried.  Eat Less Often/Avoid: Fatty cuts of meat, [place], or chicken leg, thigh, or wing. Fried cuts of meat or fish. Dairy (2 to 3 servings)  Eat More Often: Low-fat or fat-free milk, low-fat plain or light yogurt, reduced-fat or part-skim cheese.  Eat Less Often/Avoid: Milk (whole, 2%).Whole milk yogurt. Full-fat cheeses. Nuts, Seeds, and Legumes (4 to 5 servings per week)  Eat More Often: All without added salt.  Eat Less Often/Avoid: Salted nuts and seeds, canned beans with added salt. Fats and Sweets (limited)  Eat More Often: Vegetable oils, tub margarines without trans fats, sugar-free gelatin. Mayonnaise and salad dressings.  Eat Less Often/Avoid: Coconut oils, palm oils, butter, stick margarine, cream, half and half, cookies, candy, pie. FOR MORE INFORMATION The Dash Diet Eating Plan: www.dashdiet.org Document Released: 12/24/2010 Document Revised: 03/29/2011 Document Reviewed: 12/24/2010 [facility] Patient Information 2014 [place], [place].   Emergency Department Resource Guide 1) Find a Doctor and Pay Out of Pocket Although you won't have to find out who is covered by your insurance plan, it is a good idea to  ask around and get recommendations. You will then need to call the office and see if the doctor you have chosen will accept you as a new patient and what types of options they offer for patients who are self-pay. Some doctors offer discounts or will set up payment plans for their patients who do not have insurance, but you will need to ask so you aren't surprised when you get to your appointment.  2) Contact Your Local Health Department Not all health departments have doctors that can see patients for sick visits, but many do, so it is worth a call to see if yours does. If you don't know where your local health department is, you can check in your phone book. The CDC also has a tool to help you locate your state's health department, and many state websites also have listings of all of their local health departments.  3) Find a [facility] If your illness is not likely to be very severe or complicated, you may want to try a walk in clinic. These are popping up all over the country in pharmacies, drugstores, and shopping centers. They're usually staffed by nurse practitioners or physician assistants that have been trained to treat common illnesses and complaints. They're usually fairly quick and inexpensive. However, if you have serious medical issues or chronic medical problems, these are probably not your best option.  No Primary Care Doctor: - Call Health Connect at  [phone] - they can help you locate a primary care doctor that  accepts your insurance, provides certain services, etc. - Physician Referral Service- [phone]  Chronic Pain Problems: Organization         Address  Phone   Notes  [facility]  [phone] Patients need to be referred by their primary care doctor.   Medication Assistance: Organization         Address  Phone   Notes  [facility] Medication [facility] [place] [place], [place] [address] [phone] --Must be a  resident of [facility] -- Must have NO insurance coverage whatsoever (no Medicaid/ Medicare, etc.) -- The pt. MUST have a primary care doctor that directs their care regularly and follows them in the community   MedAssist  [phone]   [employer]  [phone]    Agencies that provide inexpensive medical care: Organization         Address  Phone   Notes  [place]  [phone]   [facility]    [phone]   [facility] [place], [place] [address] [phone]   [place] [address], [place] [phone]   Planned Parenthood    [phone]   [facility]    [phone]   [place] and [place], [place] Phone:  [phone],  Fax:  [phone] Hours of Operation:  9 am - 6 pm, M-F.  Also accepts Medicaid/Medicare and self-pay.  [facility] for [address], [address], [place] Phone: [phone], Fax: [phone]. Hours of Operation:  8:30 am - 5:30 pm, M-F.  Also accepts Medicaid and self-pay.  [facility] [place] [address], [place] Phone: [phone]   [place], [place], [place] [phone], Ext. [phone] Mondays & Thursdays: 7-9 AM.  First 15 patients are seen on a first come, first serve basis.    [place] Providers:  Organization         Address  Phone   Notes  [facility] [address], [place] [phone] Also accepts self-pay patients.  [facility] [id number], [place]  [phone]   [place], [address], [place] [phone]   [facility] [address], [place] [phone]   Lucianne Lei [address], [place]   [phone] Only accepts  [place] Access [place] patients after they have their name applied to their card.   Self-Pay (no insurance) in [facility]:  Organization         Address  Phone   Notes  Sickle Cell Patients, [facility] Theresa [phone]   [facility] [place] [phone]   [facility] [place]  [place], [place], [place] [phone]   [facility]/Dr. Osei-Bonsu  [address] [place] or [address], [place] [phone] Phone number for both [place] and [place] locations is the same.  [facility] [address] [place] [phone]   [facility] [address], [place] or [address] [phone]   [facility] [address] [place] [phone], phone; [phone], fax Sees patients 1st and 3rd Saturday of every month.  Must not qualify for public or private insurance (i.e. Medicaid, Medicare, [place] Health Choice, Veterans' Benefits)  Household income should be no more than 200% of the poverty level The clinic cannot treat you if you are pregnant or think you are pregnant  Sexually transmitted diseases are not treated at the clinic.    Dental Care: Organization         Address  Phone  Notes  [facility] Department of [facility] [phone] Accepts children up to age 9 who are enrolled in [place] or [place]; pregnant women with a Medicaid card; and children who have applied for Medicaid or [place] Health Choice, but were declined, whose parents can pay a reduced fee at time of service.  [facility] Department of [facility]  [address], [place] [phone] Accepts children up to age 66 who are enrolled in [place] or [place]; pregnant women  with a Medicaid card; and children who have applied for Medicaid or [place] Health Choice, but were declined, whose parents can pay a reduced fee at time of service.  [facility] [place] Adult [facility] Access PROGRAM  [place] [phone] Patients are seen by appointment only. Walk-ins are not  accepted. [place] will see patients 12 years of age and older. Monday - Tuesday (8am-5pm) Most Wednesdays (8:30-5pm) $30 per visit, cash only  [facility] Adult Dental Access PROGRAM  [address], [facility] [phone] Patients are seen by appointment only. Walk-ins are not accepted. [place] will see patients 37 years of age and older. One Wednesday Evening (Monthly: Volunteer Based).  $30 per visit, cash only  [place]  [phone] for adults; Children under age 58, call Graduate Pediatric Dentistry at [phone]. Children aged 82-14, please call [phone] to request a pediatric application.  Dental services are provided in all areas of dental care including fillings, crowns and bridges, complete and partial dentures, implants, gum treatment, root canals, and extractions. Preventive care is also provided. Treatment is provided to both adults and children. Patients are selected via a lottery and there is often a waiting list.   [facility] [address] [place]  [phone] [email or web address]   [facility] [address] [place], [place] [phone], Ext. [phone] Second and Fourth Thursday of each month, opens at 6:30 AM; Clinic ends at 9 AM.  Patients are seen on a first-come first-served basis, and a limited number are seen during each clinic.   [facility]  [address] Hillard Danker [place], [place] [phone]   Eligibility Requirements You must have lived in [place], [place], or [place] counties for at least the last three months.   You cannot be eligible for state or federal sponsored [employer], including [employer], [place], or [employer].   You generally cannot be eligible for healthcare insurance through your employer.    How to apply: Eligibility screenings are held every Tuesday and Wednesday afternoon from 1:00 pm until 4:00 pm. You do not need an appointment for the interview!  [facility] [address], [place], [place]   [place]  [place] Department  [place]  [phone]    Behavioral Health Resources in the Community: Intensive Outpatient Programs Organization         Address  Phone  Notes  Eva [place]. [address], [place], [place] [phone]   [facility] Outpatient [address], [place], [place]   ADS: Alcohol & Drug S[facility]cs [address] [place], [place]   [place] [address],  [place], [place] or [phone]   Substance Abuse Resources Organization         Address  Phone  Notes  Alcohol and Drug Services  [phone]   [place]  [phone]   [facility] [place]   Chinita Pester  [phone]   Residential & Outpatient Substance Abuse Program  [phone]   Psychological Services Organization         Address  Phone  Notes  [facility]  [place]  [phone]   [place] [address], [place] or [phone]    Mobile Crisis Teams Organization         Address  Phone  Notes  [facility], Mobile Crisis Care Unit  [phone]   [facility]  [address] [place], [place]   [facility] [address] [place] [phone]    Self-Help/Support Groups Organization  Address  Phone             Notes  [place]. of [place] - variety of support groups  [place] Call for more information  Narcotics Anonymous (NA), [facility] [address], [employer] [place]  2 meetings at this location   Special educational needs teacher         Address  Phone  Notes  [facility] [place],    [place]  [phone]   [facility]  [address], [place] [id number], [place], [place]   [place], [place] [phone] Admissions: 8am-3pm M-F  Incentives Substance [place] [address]    [place], [place] [phone]   [facility] [address] [place], [place], [place]   [facility] [address]  [place], [place]   [facility] - Intensive Outpatient [address] Kristeen [address], [place], [place]   [facility] ([place].) [place].,  [place], [place] [phone] or [phone]   [facility] (RTS) [address], [place], [place] Accepts Medicaid  [facility] [address],  [place] [phone] Substance Abuse/Addiction Treatment   [facility] Organization         Address  Phone  Notes  [facility]  [phone]   Domenic Schwab, PhD [address] Arlis Porta [place], [place]   [phone] or [phone]   [place], [place] [phone]   [facility] [address], [place], [place] [phone] Insurance/Medicaid/sponsorship through [facility] and Families [address], [address] [place]                                    [place], [place] [phone] [place] [address], [place] [phone]    Dr. Adele Schilder  [phone]   [facility] Dept. 1) [address],  [place] 2) [place] 3)  [place] [address], [place] [phone]  [phone]   [place] [phone] or [phone] (After Hours)

## 2013-04-30 LAB — I-STAT TROPONIN, ED: TROPONIN I, POC: 0 ng/mL (ref 0.00–0.08)

## 2013-05-01 ENCOUNTER — Emergency Department (INDEPENDENT_AMBULATORY_CARE_PROVIDER_SITE_OTHER)
Admission: EM | Admit: 2013-05-01 | Discharge: 2013-05-01 | Disposition: A | Discharge status: Home / Self Care | Payer: 59 | Source: Home / Self Care

## 2013-05-01 ENCOUNTER — Encounter: Payer: Self-pay | Admitting: Emergency Medicine

## 2013-05-01 DIAGNOSIS — R51 Headache: Secondary | ICD-10-CM

## 2013-05-01 DIAGNOSIS — I10 Essential (primary) hypertension: Secondary | ICD-10-CM

## 2013-05-01 DIAGNOSIS — J019 Acute sinusitis, unspecified: Secondary | ICD-10-CM

## 2013-05-01 MED ORDER — LISINOPRIL-HYDROCHLOROTHIAZIDE 20-25 MG PO TABS: 1.0000 | ORAL_TABLET | Freq: Every day | ORAL | Status: DC

## 2013-05-01 MED ORDER — BUTALBITAL-APAP-CAFFEINE 50-325-40 MG PO TABS: 1.0000 | ORAL_TABLET | Freq: Four times a day (QID) | ORAL | Status: DC | PRN

## 2013-05-01 MED ORDER — KETOROLAC TROMETHAMINE 30 MG/ML IJ SOLN
30.0000 mg | Freq: Once | INTRAMUSCULAR | Status: AC
Start: 2013-05-01 — End: 2013-05-01
  Administered 2013-05-01: 30 mg via INTRAMUSCULAR

## 2013-05-01 NOTE — ED Notes (Signed)
Anderson Malta c/o intermittent HA x 1 week. Today checking her BP @ work it was elevated. She was also seen in @ [facility] 3 days ago for HA and htn. CT and EKG were normal. Currently c/o Ha with dizziness that comes and goes. BP 166/109, repeated on other arm, 166/93.

## 2013-05-01 NOTE — Discharge Instructions (Signed)
Start Lisinopril/HCTZ.  Fiorcet as needed for headache.  [facility] with [facility] at [facility] in 8 days.   Hypertension As your heart beats, it forces blood through your arteries. This force is your blood pressure. If the pressure is too high, it is called hypertension (HTN) or high blood pressure. HTN is dangerous because you may have it and not know it. High blood pressure may mean that your heart has to work harder to pump blood. Your arteries may be narrow or stiff. The extra work puts you at risk for heart disease, stroke, and other problems.  Blood pressure consists of two numbers, a higher number over a lower, 110/72, for example. It is stated as "110 over 72." The ideal is below 120 for the top number (systolic) and under 80 for the bottom (diastolic). Write down your blood pressure today. You should pay close attention to your blood pressure if you have certain conditions such as:  Heart failure.  Prior heart attack.  Diabetes  Chronic kidney disease.  Prior stroke.  Multiple risk factors for heart disease. To see if you have HTN, your blood pressure should be measured while you are seated with your arm held at the level of the heart. It should be measured at least twice. A one-time elevated blood pressure reading (especially in the Emergency Department) does not mean that you need treatment. There may be conditions in which the blood pressure is different between your right and left arms. It is important to see your caregiver soon for a recheck. Most people have essential hypertension which means that there is not a specific cause. This type of high blood pressure may be lowered by changing lifestyle factors such as:  Stress.  Smoking.  Lack of exercise.  Excessive weight.  Drug/tobacco/alcohol use.  Eating less salt. Most people do not have symptoms from high blood pressure until it has caused damage to the body. Effective treatment can often prevent, delay or  reduce that damage. TREATMENT  When a cause has been identified, treatment for high blood pressure is directed at the cause. There are a large number of medications to treat HTN. These fall into several categories, and your caregiver will help you select the medicines that are best for you. Medications may have side effects. You should review side effects with your caregiver. If your blood pressure stays high after you have made lifestyle changes or started on medicines,   Your medication(s) may need to be changed.  Other problems may need to be addressed.  Be certain you understand your prescriptions, and know how and when to take your medicine.  Be sure to follow up with your caregiver within the time frame advised (usually within two weeks) to have your blood pressure rechecked and to review your medications.  If you are taking more than one medicine to lower your blood pressure, make sure you know how and at what times they should be taken. Taking two medicines at the same time can result in blood pressure that is too low. SEEK IMMEDIATE MEDICAL CARE IF:  You develop a severe headache, blurred or changing vision, or confusion.  You have unusual weakness or numbness, or a faint feeling.  You have severe chest or abdominal pain, vomiting, or breathing problems. MAKE SURE YOU:   Understand these instructions.  Will watch your condition.  Will get help right away if you are not doing well or get worse. Document Released: 01/04/2005 Document Revised: 03/29/2011 Document Reviewed: 08/25/2007  ExitCare Patient Information 2014 [place].

## 2013-05-01 NOTE — ED Provider Notes (Signed)
CSN: [id number]     Arrival date & time 05/01/13  1042 History   None    Chief Complaint  Patient presents with  . Hypertension   (Consider location/radiation/quality/duration/timing/severity/associated sxs/prior Treatment) HPI Patient is a 33 yo female who presents to the clinic with mother for headache and HTN. She was seen in [facility] 04/23/13 for sinusitis/URI and was given zpak. Did not take and ended up going to ED on 04/28/13. She had complete work up for HA. CT positive for sinusitis. Negative troponins. EKG normal. WBC slightly elevated, but no other concerning lab work. Given Zpak and shot of Toradol in ED. Sinusitis symptoms have improved. Denies any sinus pressure, fever, ear pain, ST, cough. She continues to have HA on the top of her entire head on and off for the last week. Describes it as pulsatile. She has taken BP at home and ranges from 148/100 to 198/114. Headache pain is 8/10. Tylenol helps ease the pain. toradol gave significant HA reduction. Denies any n/v/d, vision changes, photophobia, or phonophobia. NO CP , palpitations.   Past Medical History  Diagnosis Date  . Obesity, morbid    Past Surgical History  Procedure Laterality Date  . Tonsillectomy    . Wisdom tooth extraction     Family History  Problem Relation Age of Onset  . COPD Mother   . Hypertension Father   . Diabetes Father    History  Substance Use Topics  . Smoking status: Never Smoker   . Smokeless tobacco: Never Used  . Alcohol Use: No   OB History   Grav Para Term Preterm Abortions TAB SAB Ect Mult Living                 Review of Systems  All other systems reviewed and are negative.   Allergies  Review of patient's allergies indicates no known allergies.  Home Medications   Prior to Admission medications   Medication Sig Start Date End Date Taking? Authorizing Provider  azithromycin (ZITHROMAX) 500 MG tablet Take by mouth daily.   Yes Historical Provider, MD   butalbital-acetaminophen-caffeine (FIORICET, ESGIC) 50-325-40 MG per tablet Take 1 tablet by mouth every 6 (six) hours as needed for headache. 05/01/13   Donella Stade, PA-C  lisinopril-hydrochlorothiazide (PRINZIDE,ZESTORETIC) 20-25 MG per tablet Take 1 tablet by mouth daily. 05/01/13   Jihan Rudy L Koya Hunger, PA-C   BP 166/109  Pulse 84  Temp(Src) 98.3 F (36.8 C) (Oral)  Resp 20  Wt 397 lb (180.078 kg)  SpO2 96%  LMP 04/04/2013 Physical Exam  Constitutional: She is oriented to person, place, and time. She appears well-developed and well-nourished.  Morbidly obese.   HENT:  Head: Normocephalic and atraumatic.  Right Ear: External ear normal.  Left Ear: External ear normal.  Nose: Nose normal.  Mouth/Throat: Oropharynx is clear and moist. No oropharyngeal exudate.  TM's clear bilaterally.  Negative for any maxillary or frontal sinus tenderness.   Eyes: Conjunctivae and EOM are normal. Pupils are equal, round, and reactive to light. Right eye exhibits no discharge. Left eye exhibits no discharge.  Neck: Normal range of motion. Neck supple.  Cardiovascular: Normal rate, regular rhythm and normal heart sounds.   Pulmonary/Chest: Effort normal and breath sounds normal. She has no wheezes.  Lymphadenopathy:    She has no cervical adenopathy.  Neurological: She is alert and oriented to person, place, and time.  Skin: Skin is dry.  Psychiatric: She has a normal mood and affect. Her behavior is normal.  ED Course  Procedures (including critical care time) Labs Review Labs Reviewed - No data to display  Results for orders placed during the hospital encounter of 04/28/13  CBC WITH DIFFERENTIAL      Result Value Ref Range   WBC 11.6 (*) 4.0 - 10.5 K/uL   RBC 5.22 (*) 3.87 - 5.11 MIL/uL   Hemoglobin 14.9  12.0 - 15.0 g/dL   HCT 44.2  36.0 - 46.0 %   MCV 84.7  78.0 - 100.0 fL   MCH 28.5  26.0 - 34.0 pg   MCHC 33.7  30.0 - 36.0 g/dL   RDW 13.7  11.5 - 15.5 %   Platelets 202  150 -  400 K/uL   Neutrophils Relative % 67  43 - 77 %   Neutro Abs 7.7  1.7 - 7.7 K/uL   Lymphocytes Relative 28  12 - 46 %   Lymphs Abs 3.2  0.7 - 4.0 K/uL   Monocytes Relative 5  3 - 12 %   Monocytes Absolute 0.6  0.1 - 1.0 K/uL   Eosinophils Relative 0  0 - 5 %   Eosinophils Absolute 0.1  0.0 - 0.7 K/uL   Basophils Relative 0  0 - 1 %   Basophils Absolute 0.0  0.0 - 0.1 K/uL  BASIC METABOLIC PANEL      Result Value Ref Range   Sodium 139  137 - 147 mEq/L   Potassium 4.1  3.7 - 5.3 mEq/L   Chloride 102  96 - 112 mEq/L   CO2 21  19 - 32 mEq/L   Glucose, Bld 102 (*) 70 - 99 mg/dL   BUN 9  6 - 23 mg/dL   Creatinine, Ser 0.66  0.50 - 1.10 mg/dL   Calcium 9.5  8.4 - 10.5 mg/dL   GFR calc non Af Amer >90  >90 mL/min   GFR calc Af Amer >90  >90 mL/min  I-STAT TROPOININ, ED      Result Value Ref Range   Troponin i, poc 0.00  0.00 - 0.08 ng/mL   Comment 3           I-STAT TROPOININ, ED      Result Value Ref Range   Troponin i, poc 0.00  0.00 - 0.08 ng/mL   Comment 3            Imaging Review No results found.   MDM   1. Headache(784.0)   2. Essential hypertension, benign   3. Sinusitis, acute    Pt has had complete work up in ER on 04/28/13. I do think HA is likely coming from elevated BP. Started patient on Lisinopril/HCTZ 20/25mg  once daily, Last BMP 04/28/13 was WNL. Discussed to take in the mornings. Discussed side effects of increased urination and possible dry cough.   Pt encouraged to make appt with me at [facility] in 8 days to continue to follow.   Discussed staying away from NSAIDs due to possible BP elevation. I did give a shot of Toradol 30mg  IM in office today since she had significant pain reduction after ER visit and was given Toradol.   I also gave a few tablets of Fiorcet to use up to every 6 hours as needed for HA. Hopefully with BP reduction we can improve Headaches. Symptoms of sinusitis have seemed to improve per pt. Cannot exclude some chronic  sinusitis. Due to BP elevation I do not feel appropriate to give steroids today. Certainly can consider at  later date if BP is under control and still having symptoms. Pt is morbidly obese discussed weight loss but did not make a plan. Will continue to follow in primary care.    Donella Stade, PA-C 05/01/13 1153  Donella Stade, PA-C 05/01/13 1154

## 2013-05-11 ENCOUNTER — Encounter: Payer: Self-pay | Admitting: Physician Assistant

## 2013-05-11 ENCOUNTER — Ambulatory Visit (INDEPENDENT_AMBULATORY_CARE_PROVIDER_SITE_OTHER): Payer: 59 | Admitting: Physician Assistant

## 2013-05-11 VITALS — BP 131/79 | HR 93 | Ht 68.0 in | Wt 389.0 lb

## 2013-05-11 DIAGNOSIS — Z131 Encounter for screening for diabetes mellitus: Secondary | ICD-10-CM

## 2013-05-11 DIAGNOSIS — Z13220 Encounter for screening for lipoid disorders: Secondary | ICD-10-CM

## 2013-05-11 DIAGNOSIS — I10 Essential (primary) hypertension: Secondary | ICD-10-CM | POA: Insufficient documentation

## 2013-05-11 DIAGNOSIS — R51 Headache: Secondary | ICD-10-CM

## 2013-05-11 MED ORDER — FLUTICASONE PROPIONATE 50 MCG/ACT NA SUSP: 2.0000 | Freq: Every day | NASAL | Status: DC

## 2013-05-11 MED ORDER — AZILSARTAN-CHLORTHALIDONE 40-12.5 MG PO TABS: ORAL_TABLET | ORAL | Status: DC

## 2013-05-12 LAB — COMPLETE METABOLIC PANEL WITH GFR
ALBUMIN: 4.2 g/dL (ref 3.5–5.2)
ALT: 50 U/L — ABNORMAL HIGH (ref 0–35)
AST: 45 U/L — AB (ref 0–37)
Alkaline Phosphatase: 113 U/L (ref 39–117)
BUN: 11 mg/dL (ref 6–23)
CO2: 26 mEq/L (ref 19–32)
CREATININE: 0.58 mg/dL (ref 0.50–1.10)
Calcium: 9.6 mg/dL (ref 8.4–10.5)
Chloride: 98 mEq/L (ref 96–112)
GFR, Est African American: >89 mL/min
GFR, Est Non African American: >89 mL/min
Glucose, Bld: 118 mg/dL — ABNORMAL HIGH (ref 70–99)
POTASSIUM: 4.2 meq/L (ref 3.5–5.3)
Sodium: 133 mEq/L — ABNORMAL LOW (ref 135–145)
Total Bilirubin: 0.9 mg/dL (ref 0.2–1.2)
Total Protein: 7.3 g/dL (ref 6.0–8.3)

## 2013-05-12 LAB — LIPID PANEL
Cholesterol: 255 mg/dL — ABNORMAL HIGH (ref 0–200)
HDL: 37 mg/dL — ABNORMAL LOW (ref >39)
LDL Cholesterol: 188 mg/dL — ABNORMAL HIGH (ref 0–99)
Total CHOL/HDL Ratio: 6.9 Ratio
Triglycerides: 152 mg/dL — ABNORMAL HIGH (ref <150)
VLDL: 30 mg/dL (ref 0–40)

## 2013-05-13 NOTE — Progress Notes (Signed)
   Subjective:    Patient ID: Valerie Aguilar, female    DOB: 1980/03/24, 33 y.o.   MRN: [id number]  HPI Pt is a 33 yo female to establish care. She was seen in UC by myself for HTN and sinusitis. Her sinutisi has resolved. Her HA's are much improved but continues to have them. No nausea or vomiting assoicated with HA's. Usally on right side top of head.  Not taking anything for them.  She is taking lisinpril daily but does have a dry cough since 3 day of taking. 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 They are not controlled at home. Average is 150's/90 but get's as high as 154/100. No CP or palpitations.   . Active Ambulatory Problems    Diagnosis Date Noted  . Essential hypertension, benign 05/11/2013   Resolved Ambulatory Problems    Diagnosis Date Noted  . No Resolved Ambulatory Problems   Past Medical History  Diagnosis Date  . Obesity, morbid    . Family History  Problem Relation Age of Onset  . COPD Mother   . Hypertension Father   . Diabetes Father   . Stroke Paternal Aunt   . Cancer Paternal Uncle   . Diabetes Paternal Grandmother   . Hypertension Paternal Grandmother    . History   Social History  . Marital Status: Married    Spouse Name: N/A    Number of Children: N/A  . Years of Education: N/A   Occupational History  . Not on file.   Social History Main Topics  . Smoking status: Never Smoker   . Smokeless tobacco: Never Used  . Alcohol Use: No  . Drug Use: No  . Sexual Activity: Yes    Birth Control/ Protection: None   Other Topics Concern  . Not on file   Social History Narrative  . No narrative on file      Review of Systems  All other systems reviewed and are negative.      Objective:   Physical Exam  Constitutional: She is oriented to person, place, and time. She appears well-developed and well-nourished.  Morbid obesity.   HENT:  Head: Normocephalic and atraumatic.  Cardiovascular: Normal rate, regular rhythm and normal  heart sounds.   Pulmonary/Chest: Effort normal and breath sounds normal.  Neurological: She is alert and oriented to person, place, and time.  Skin: Skin is dry.  Psychiatric: She has a normal mood and affect. Her behavior is normal.          Assessment & Plan:  HTN- BP is good today in office; however, pt is reporting and recording BP's ranging in the 150/100's majority of days. Pt also had an ACE cough. Stop lisinopril. Gave samples of Edarbi 40/12.5mg  daily. Follow up in 4 weeks. Continue to check BP's and call with any changes. Labs were given to have blood drawn today.   Headache- seems to be improving with BP reduction. Suggested ibuprofen when he has HA. Hopefully as we control BP's will continue to improve HA's.

## 2013-05-13 NOTE — Telephone Encounter (Signed)
Call a nurse called with pt having BP of 88/over something and feeling really bad after starting on medication. After a few hours and fluids had increased to 98/over something. Pt was advised to stop medication for 24 hours and then could start back 1/2 of the tablet and recheck Monday morning.

## 2013-05-14 ENCOUNTER — Telehealth: Payer: Self-pay | Admitting: *Deleted

## 2013-05-14 ENCOUNTER — Telehealth: Payer: Self-pay | Admitting: Physician Assistant

## 2013-05-14 DIAGNOSIS — R7301 Impaired fasting glucose: Secondary | ICD-10-CM

## 2013-05-14 NOTE — Telephone Encounter (Signed)
Patient called request a call back. She stated she missed call from [place] and her husband deleted message accidentally

## 2013-05-17 LAB — HEMOGLOBIN A1C
HEMOGLOBIN A1C: 6 % — AB (ref <5.7)
MEAN PLASMA GLUCOSE: 126 mg/dL — AB (ref <117)

## 2013-05-18 ENCOUNTER — Encounter: Payer: Self-pay | Admitting: Physician Assistant

## 2013-05-18 DIAGNOSIS — R7303 Prediabetes: Secondary | ICD-10-CM | POA: Insufficient documentation

## 2013-05-31 ENCOUNTER — Telehealth: Payer: Self-pay | Admitting: *Deleted

## 2013-05-31 DIAGNOSIS — R748 Abnormal levels of other serum enzymes: Secondary | ICD-10-CM

## 2013-05-31 LAB — COMPREHENSIVE METABOLIC PANEL
ALBUMIN: 3.7 g/dL (ref 3.5–5.2)
ALK PHOS: 105 U/L (ref 39–117)
ALT: 44 U/L — AB (ref 0–35)
AST: 39 U/L — AB (ref 0–37)
BUN: 9 mg/dL (ref 6–23)
CHLORIDE: 103 meq/L (ref 96–112)
CO2: 26 mEq/L (ref 19–32)
Calcium: 9.1 mg/dL (ref 8.4–10.5)
Creat: 0.65 mg/dL (ref 0.50–1.10)
Glucose, Bld: 90 mg/dL (ref 70–99)
POTASSIUM: 4.2 meq/L (ref 3.5–5.3)
SODIUM: 137 meq/L (ref 135–145)
Total Bilirubin: 0.9 mg/dL (ref 0.2–1.2)
Total Protein: 6.6 g/dL (ref 6.0–8.3)

## 2013-05-31 NOTE — Telephone Encounter (Signed)
cmp ordered to recheck liver enzymes.

## 2013-06-04 ENCOUNTER — Encounter: Payer: Self-pay | Admitting: Physician Assistant

## 2013-06-04 ENCOUNTER — Ambulatory Visit (INDEPENDENT_AMBULATORY_CARE_PROVIDER_SITE_OTHER): Payer: 59 | Admitting: Physician Assistant

## 2013-06-04 DIAGNOSIS — E785 Hyperlipidemia, unspecified: Secondary | ICD-10-CM | POA: Insufficient documentation

## 2013-06-04 DIAGNOSIS — I10 Essential (primary) hypertension: Secondary | ICD-10-CM

## 2013-06-04 DIAGNOSIS — R748 Abnormal levels of other serum enzymes: Secondary | ICD-10-CM

## 2013-06-04 LAB — IRON AND TIBC
%SAT: 20 % (ref 20–55)
Iron: 62 ug/dL (ref 42–145)
TIBC: 304 ug/dL (ref 250–470)
UIBC: 242 ug/dL (ref 125–400)

## 2013-06-04 LAB — CBC WITH DIFFERENTIAL/PLATELET
BASOS ABS: 0 10*3/uL (ref 0.0–0.1)
Basophils Relative: 0 % (ref 0–1)
EOS ABS: 0.1 10*3/uL (ref 0.0–0.7)
EOS PCT: 1 % (ref 0–5)
HCT: 42.8 % (ref 36.0–46.0)
Hemoglobin: 14.5 g/dL (ref 12.0–15.0)
LYMPHS ABS: 2.4 10*3/uL (ref 0.7–4.0)
Lymphocytes Relative: 31 % (ref 12–46)
MCH: 28.2 pg (ref 26.0–34.0)
MCHC: 33.9 g/dL (ref 30.0–36.0)
MCV: 83.1 fL (ref 78.0–100.0)
Monocytes Absolute: 0.3 10*3/uL (ref 0.1–1.0)
Monocytes Relative: 4 % (ref 3–12)
NEUTROS PCT: 64 % (ref 43–77)
Neutro Abs: 5 10*3/uL (ref 1.7–7.7)
PLATELETS: 192 10*3/uL (ref 150–400)
RBC: 5.15 MIL/uL — AB (ref 3.87–5.11)
RDW: 14.9 % (ref 11.5–15.5)
WBC: 7.8 10*3/uL (ref 4.0–10.5)

## 2013-06-04 LAB — ALBUMIN: ALBUMIN: 3.9 g/dL (ref 3.5–5.2)

## 2013-06-04 MED ORDER — ATORVASTATIN CALCIUM 40 MG PO TABS: 40.0000 mg | ORAL_TABLET | Freq: Every day | ORAL | Status: DC

## 2013-06-04 NOTE — Progress Notes (Signed)
   Subjective:    Patient ID: Valerie Aguilar, female    DOB: April 02, 1980, 33 y.o.   MRN: [id number]  HPI Pt is a 33 yo female who presents to the clinic to follow up on HTN. Was not able to tolerate lisinopril due to cough, edarbi caused too low of BP. Pt has been checking BP and running 125-135/80-92. She denies any headaches, CP, palpitations. 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   She really wants to lose weight. She is making some diet changes but feels like she needs more. Exercise is hard but she is trying to walk regularly.   Elevated liver enzymes- checked and remain elevated. Aunt and grandmother both had liver problems and she is concerned.   Hyperlipidemia- she has not started a statin. She would like to.    Review of Systems  All other systems reviewed and are negative.      Objective:   Physical Exam  Constitutional: She is oriented to person, place, and time. She appears well-developed and well-nourished.  Obese, morbidly.  HENT:  Head: Normocephalic and atraumatic.  Cardiovascular: Normal rate, regular rhythm and normal heart sounds.   Pulmonary/Chest: Effort normal and breath sounds normal.  Neurological: She is alert and oriented to person, place, and time.  Skin: Skin is dry.  Psychiatric: She has a normal mood and affect. Her behavior is normal.          Assessment & Plan:  HTN- will hold off on medication.I trust pt's BP log is accurate. Will recheck in 2 months. Continue to keep log if above 140/90 please call office or schedule follow up. I do think if pt could lose weight blood pressure would not be a problem.   Obesity severe- BMI is 60. I could start on medication but I feel like medication with possibility of surgery would be a good option for pt. Will refer to Dr. Valetta Close weight loss. Encouraged until then to count calories and start walking.   Hyperlipidemia- will start statin today. Recheck in 6 months. Low fat diet  encouraged. Stop all fried foods.   Elevated liver enyzmes- likely due to hyperlipidemia or fatty liver. Will get blood work up for hepatitis, blood disorders, iron problems and get ultrasound to look at liver.

## 2013-06-04 NOTE — Patient Instructions (Signed)
Start lipitor 40mg  daily.   Cholesterol Cholesterol is a white, waxy, fat-like protein needed by your body in small amounts. The liver makes all the cholesterol you need. It is carried from the liver by the blood through the blood vessels. Deposits (plaque) may build up on blood vessel walls. This makes the arteries narrower and stiffer. Plaque increases the risk for heart attack and stroke. You cannot feel your cholesterol level even if it is very high. The only way to know is by a blood test to check your lipid (fats) levels. Once you know your cholesterol levels, you should keep a record of the test results. Work with your caregiver to to keep your levels in the desired range. WHAT THE RESULTS MEAN:  Total cholesterol is a rough measure of all the cholesterol in your blood.  LDL is the so-called bad cholesterol. This is the type that deposits cholesterol in the walls of the arteries. You want this level to be low.  HDL is the good cholesterol because it cleans the arteries and carries the LDL away. You want this level to be high.  Triglycerides are fat that the body can either burn for energy or store. High levels are closely linked to heart disease. DESIRED LEVELS:  Total cholesterol below 200.  LDL below 100 for people at risk, below 70 for very high risk.  HDL above 50 is good, above 60 is best.  Triglycerides below 150. HOW TO LOWER YOUR CHOLESTEROL:  Diet.  Choose fish or white meat chicken and [place], roasted or baked. Limit fatty cuts of red meat, fried foods, and processed meats, such as sausage and lunch meat.  Eat lots of fresh fruits and vegetables. Choose whole grains, beans, pasta, potatoes and cereals.  Use only small amounts of olive, corn or canola oils. Avoid butter, mayonnaise, shortening or palm kernel oils. Avoid foods with trans-fats.  Use skim/nonfat milk and low-fat/nonfat yogurt and cheeses. Avoid whole milk, cream, ice cream, egg yolks and cheeses. Healthy  desserts include angel food cake, ginger snaps, animal crackers, hard candy, popsicles, and low-fat/nonfat frozen yogurt. Avoid pastries, cakes, pies and cookies.  Exercise.  A regular program helps decrease LDL and raises HDL.  Helps with weight control.  Do things that increase your activity level like gardening, walking, or taking the stairs.  Medication.  May be prescribed by your caregiver to help lowering cholesterol and the risk for heart disease.  You may need medicine even if your levels are normal if you have several risk factors. HOME CARE INSTRUCTIONS   Follow your diet and exercise programs as suggested by your caregiver.  Take medications as directed.  Have blood work done when your caregiver feels it is necessary. MAKE SURE YOU:   Understand these instructions.  Will watch your condition.  Will get help right away if you are not doing well or get worse. Document Released: 09/29/2000 Document Revised: 03/29/2011 Document Reviewed: 10/18/2012 [facility] Patient Information 2014 [place], [place].

## 2013-06-04 NOTE — Addendum Note (Signed)
Addended by: Beatris Ship L on: 06/04/2013 02:18 PM   Modules accepted: Orders

## 2013-06-05 LAB — PROTIME-INR
INR: 1.05 (ref <1.50)
Prothrombin Time: 13.6 seconds (ref 11.6–15.2)

## 2013-06-05 LAB — FERRITIN: Ferritin: 190 ng/mL (ref 10–291)

## 2013-06-05 LAB — HEPATITIS PANEL, ACUTE
HCV AB: NEGATIVE
HEP B C IGM: NONREACTIVE
HEP B S AG: NEGATIVE
Hep A IgM: NONREACTIVE

## 2014-05-31 ENCOUNTER — Ambulatory Visit (INDEPENDENT_AMBULATORY_CARE_PROVIDER_SITE_OTHER): Payer: 59 | Admitting: Family Medicine

## 2014-05-31 ENCOUNTER — Encounter: Payer: Self-pay | Admitting: Family Medicine

## 2014-05-31 VITALS — BP 151/98 | HR 80 | Wt >= 6400 oz

## 2014-05-31 DIAGNOSIS — I10 Essential (primary) hypertension: Secondary | ICD-10-CM

## 2014-05-31 MED ORDER — AMLODIPINE BESYLATE 5 MG PO TABS: 5.0000 mg | ORAL_TABLET | Freq: Every day | ORAL | Status: DC

## 2014-05-31 NOTE — Progress Notes (Signed)
CC: Valerie Aguilar is a 34 y.o. female is here for elevated BP at work   Subjective: HPI:  The past 3 or 4 days she's had a little bit of nausea and a headache. Symptoms were a little worse this morning so she took Zofran. Late this afternoon she vomited at work her blood pressure was taken and it was around 160/100. She tells me she's battled issues with blood pressure in the past but currently is not on any blood pressure medication. Nausea has improved since vomiting. She has an appetite has not had any abdominal pain nor diarrhea nor constipation. She denies fevers, chills, nasal congestion, facial pressure, hearing loss, confusion or any other motor or sensory disturbances other than that described above. No formal diet with regard to sodium restriction. Headaches and nausea severity seem to correlate with degree of elevated blood pressure.   Review Of Systems Outlined In HPI  Past Medical History  Diagnosis Date  . Obesity, morbid     Past Surgical History  Procedure Laterality Date  . Tonsillectomy    . Wisdom tooth extraction     Family History  Problem Relation Age of Onset  . COPD Mother   . Hypertension Father   . Diabetes Father   . Stroke Paternal Aunt   . Cancer Paternal Uncle   . Diabetes Paternal Grandmother   . Hypertension Paternal Grandmother     History   Social History  . Marital Status: Married    Spouse Name: N/A  . Number of Children: N/A  . Years of Education: N/A   Occupational History  . Not on file.   Social History Main Topics  . Smoking status: Never Smoker   . Smokeless tobacco: Never Used  . Alcohol Use: No  . Drug Use: No  . Sexual Activity: Yes    Birth Control/ Protection: None   Other Topics Concern  . Not on file   Social History Narrative     Objective: BP 151/98 mmHg  Pulse 80  Wt 404 lb (183.253 kg)  General: Alert and Oriented, No Acute Distress HEENT: Pupils equal, round, reactive to light. Conjunctivae clear.   External ears unremarkable, canals clear with intact TMs with appropriate landmarks.  Middle ear appears open without effusion. Pink inferior turbinates.  Moist mucous membranes, pharynx without inflammation nor lesions.  Neck supple without palpable lymphadenopathy nor abnormal masses. Neuro: Cranial nerves II through XII grossly intact, gait normal Lungs: Clear to auscultation bilaterally, no wheezing/ronchi/rales.  Comfortable work of breathing. Good air movement. Cardiac: Regular rate and rhythm. Normal S1/S2.  No murmurs, rubs, nor gallops.   Mental Status: No depression, anxiety, nor agitation. Skin: Warm and dry.  Assessment & Plan: Taetum was seen today for elevated bp at work.  Diagnoses and all orders for this visit:  Essential hypertension, benign Orders: -     amLODipine (NORVASC) 5 MG tablet; Take 1 tablet (5 mg total) by mouth daily.   Essential hypertension: She currently does not use any birth control and is not planning on having a child but a at the same time would welcome a pregnancy therefore avoiding losartan begin amlodipine and follow up in 2 weeks  Return in about 2 weeks (around 06/14/2014).  25 minutes spent face-to-face during visit today of which at least 50% was counseling or coordinating care regarding: 1. Essential hypertension, benign

## 2014-06-14 ENCOUNTER — Encounter: Payer: Self-pay | Admitting: Family Medicine

## 2014-06-14 ENCOUNTER — Ambulatory Visit (INDEPENDENT_AMBULATORY_CARE_PROVIDER_SITE_OTHER): Payer: 59 | Admitting: Family Medicine

## 2014-06-14 VITALS — BP 144/92 | HR 92 | Wt >= 6400 oz

## 2014-06-14 DIAGNOSIS — I10 Essential (primary) hypertension: Secondary | ICD-10-CM

## 2014-06-14 MED ORDER — AMLODIPINE BESYLATE 5 MG PO TABS: 5.0000 mg | ORAL_TABLET | Freq: Every day | ORAL | Status: DC

## 2014-06-14 NOTE — Progress Notes (Signed)
CC: Valerie Aguilar is a 34 y.o. female is here for Hypertension   Subjective: HPI:  Follow-up essential hypertension: Taking amlodipine 5 mg daily. No known side effects. Blood pressures at home have consistently been below 140/90. 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 She's also tried to cut back on eating out and reducing sodium in her diet. No chest pain shortness of breath orthopnea nor peripheral edema. Denies constipation   Review Of Systems Outlined In HPI  Past Medical History  Diagnosis Date  . Obesity, morbid     Past Surgical History  Procedure Laterality Date  . Tonsillectomy    . Wisdom tooth extraction     Family History  Problem Relation Age of Onset  . COPD Mother   . Hypertension Father   . Diabetes Father   . Stroke Paternal Aunt   . Cancer Paternal Uncle   . Diabetes Paternal Grandmother   . Hypertension Paternal Grandmother     History   Social History  . Marital Status: Married    Spouse Name: N/A  . Number of Children: N/A  . Years of Education: N/A   Occupational History  . Not on file.   Social History Main Topics  . Smoking status: Never Smoker   . Smokeless tobacco: Never Used  . Alcohol Use: No  . Drug Use: No  . Sexual Activity: Yes    Birth Control/ Protection: None   Other Topics Concern  . Not on file   Social History Narrative     Objective: BP 144/92 mmHg  Pulse 92  Wt 402 lb (182.346 kg)  General: Alert and Oriented, No Acute Distress HEENT: Pupils equal, round, reactive to light. Conjunctivae clear.   Lungs: Clear to auscultation bilaterally, no wheezing/ronchi/rales.  Comfortable work of breathing. Good air movement. Cardiac: Regular rate and rhythm. Normal S1/S2.  No murmurs, rubs, nor gallops.   Extremities: No peripheral edema.  Strong peripheral pulses.  Mental Status: No depression, anxiety, nor agitation. Skin: Warm and dry.  Assessment &  Plan: Valerie Aguilar was seen today for hypertension.  Diagnoses and all orders for this visit:  Essential hypertension, benign Orders: -     amLODipine (NORVASC) 5 MG tablet; Take 1 tablet (5 mg total) by mouth daily.   Essential hypertension: Controlled continue amlodipine. I've asked her to call me if blood pressures go above 140/90, if that situation occur she can double up on her amlodipine but like to know so I can call in higher dose to her pharmacy.  Return in about 3 months (around 09/14/2014).

## 2014-09-06 ENCOUNTER — Other Ambulatory Visit (HOSPITAL_BASED_OUTPATIENT_CLINIC_OR_DEPARTMENT_OTHER): Payer: Self-pay | Admitting: Orthopedic Surgery

## 2014-09-06 DIAGNOSIS — M25561 Pain in right knee: Secondary | ICD-10-CM

## 2014-09-09 ENCOUNTER — Encounter: Payer: Self-pay | Admitting: Physician Assistant

## 2014-09-09 ENCOUNTER — Ambulatory Visit (INDEPENDENT_AMBULATORY_CARE_PROVIDER_SITE_OTHER): Payer: 59 | Admitting: Physician Assistant

## 2014-09-09 VITALS — BP 151/87 | HR 101 | Ht 68.0 in | Wt >= 6400 oz

## 2014-09-09 DIAGNOSIS — Z23 Encounter for immunization: Secondary | ICD-10-CM

## 2014-09-09 DIAGNOSIS — I10 Essential (primary) hypertension: Secondary | ICD-10-CM | POA: Diagnosis not present

## 2014-09-09 DIAGNOSIS — E785 Hyperlipidemia, unspecified: Secondary | ICD-10-CM

## 2014-09-09 MED ORDER — ATORVASTATIN CALCIUM 40 MG PO TABS: 40.0000 mg | ORAL_TABLET | Freq: Every day | ORAL | Status: DC

## 2014-09-09 MED ORDER — AMLODIPINE BESYLATE 10 MG PO TABS: 10.0000 mg | ORAL_TABLET | Freq: Every day | ORAL | Status: DC

## 2014-09-09 NOTE — Progress Notes (Signed)
   Subjective:    Patient ID: Valerie Aguilar, female    DOB: 28-May-1980, 34 y.o.   MRN: [id number]  HPI  Pt presents to the clinic for follow up.   HTN- on norvasc 5mg  daily. She has continued to notice BP around 140-150's and 85-95. 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 She denies any CP/SOB/palpitations. Pulse normal when she has checked at work. No headaches.   In the past denied statin. LDL 188 at last check around 1 year ago.     Review of Systems  All other systems reviewed and are negative.      Objective:   Physical Exam  Constitutional: She is oriented to person, place, and time. She appears well-developed and well-nourished.  Obese.   HENT:  Head: Normocephalic and atraumatic.  Cardiovascular: Normal rate, regular rhythm and normal heart sounds.   Pulmonary/Chest: Effort normal and breath sounds normal.  Neurological: She is alert and oriented to person, place, and time.  Skin: Skin is dry.  Psychiatric: She has a normal mood and affect. Her behavior is normal.          Assessment & Plan:  HTN- increased norvasc today to 10mg . Discussed with patient to keep check BP at home and work if not under 140/90 then follow up sooner. Follow up nurse visit in 3 months. Encouraged weight loss to help with BP.   Hyperlipidemia- started Lipitor. Discussed side effects. Recheck in 6 months with repeat lipid panel. Encouraged low fat diet with exercise.

## 2014-09-14 ENCOUNTER — Ambulatory Visit (HOSPITAL_BASED_OUTPATIENT_CLINIC_OR_DEPARTMENT_OTHER)
Admission: RE | Admit: 2014-09-14 | Discharge: 2014-09-14 | Disposition: A | Discharge status: Home / Self Care | Payer: 59 | Source: Ambulatory Visit | Attending: Orthopedic Surgery | Admitting: Orthopedic Surgery

## 2014-09-14 DIAGNOSIS — M25561 Pain in right knee: Secondary | ICD-10-CM | POA: Insufficient documentation

## 2014-09-14 DIAGNOSIS — M9420 Chondromalacia, unspecified site: Secondary | ICD-10-CM | POA: Diagnosis not present

## 2014-09-14 DIAGNOSIS — M25461 Effusion, right knee: Secondary | ICD-10-CM | POA: Insufficient documentation

## 2014-09-25 ENCOUNTER — Encounter: Payer: Self-pay | Admitting: Physician Assistant

## 2014-09-25 DIAGNOSIS — M25561 Pain in right knee: Secondary | ICD-10-CM | POA: Insufficient documentation

## 2015-01-07 ENCOUNTER — Ambulatory Visit (INDEPENDENT_AMBULATORY_CARE_PROVIDER_SITE_OTHER): Payer: 59 | Admitting: Physician Assistant

## 2015-01-07 ENCOUNTER — Other Ambulatory Visit: Payer: Self-pay | Admitting: Physician Assistant

## 2015-01-07 ENCOUNTER — Encounter: Payer: Self-pay | Admitting: Physician Assistant

## 2015-01-07 VITALS — BP 153/76 | HR 83 | Ht 68.0 in | Wt >= 6400 oz

## 2015-01-07 DIAGNOSIS — J208 Acute bronchitis due to other specified organisms: Secondary | ICD-10-CM

## 2015-01-07 DIAGNOSIS — R030 Elevated blood-pressure reading, without diagnosis of hypertension: Secondary | ICD-10-CM

## 2015-01-07 MED ORDER — HYDROCOD POLST-CPM POLST ER 10-8 MG/5ML PO SUER: 5.0000 mL | Freq: Two times a day (BID) | ORAL | Status: DC | PRN

## 2015-01-07 MED ORDER — ALBUTEROL SULFATE HFA 108 (90 BASE) MCG/ACT IN AERS: 2.0000 | INHALATION_SPRAY | Freq: Four times a day (QID) | RESPIRATORY_TRACT | Status: DC | PRN

## 2015-01-07 MED ORDER — AZITHROMYCIN 250 MG PO TABS: ORAL_TABLET | ORAL | Status: DC

## 2015-01-07 MED ORDER — PREDNISONE 50 MG PO TABS: ORAL_TABLET | ORAL | Status: DC

## 2015-01-07 NOTE — Progress Notes (Signed)
   Subjective:    Patient ID: Valerie Aguilar, female    DOB: [birth year], 34 y.o.   MRN: [id number]  HPI  Pt is a 34 yo morbidly obese female who presents to the clinic with upper respiratory symptoms for 3 weeks. A MD at work gave her avelox and she finished about 6 days ago. She has not improved. She feels very SOB and chest tightness. Ear pain comes and goes. No fever. Does feel hot all the time. Also taking sudafed and mucinex. Does not have inhaler or hx of asthma.     Review of Systems  All other systems reviewed and are negative.      Objective:   Physical Exam  Constitutional: She is oriented to person, place, and time. She appears well-developed and well-nourished.  HENT:  Head: Normocephalic and atraumatic.  Right Ear: External ear normal.  Left Ear: External ear normal.  Nose: Nose normal.  Mouth/Throat: No oropharyngeal exudate.  TM's clear bilaterally.   Eyes: Conjunctivae are normal.  Neck: Normal range of motion. Neck supple.  Cardiovascular: Normal rate, regular rhythm and normal heart sounds.   Pulmonary/Chest: Effort normal.  Expiratory wheezing bilaterally.    Lymphadenopathy:    She has no cervical adenopathy.  Neurological: She is alert and oriented to person, place, and time.  Skin:  Flushed cheeks bilaterally.   Psychiatric: She has a normal mood and affect. Her behavior is normal.          Assessment & Plan:  Acute bronchitis- zpak given. tussinonex for cough. Prednisone for 5 days. Albuterol inhaler every 4-6 hours as needed for SOB and wheezing. Follow up as needed.   Elevated BP- likely due to sickness. Per pt checks at work and below 140/90.

## 2015-01-08 ENCOUNTER — Encounter: Payer: Self-pay | Admitting: Physician Assistant

## 2015-02-26 DIAGNOSIS — Z01419 Encounter for gynecological examination (general) (routine) without abnormal findings: Secondary | ICD-10-CM | POA: Diagnosis not present

## 2015-02-26 DIAGNOSIS — N915 Oligomenorrhea, unspecified: Secondary | ICD-10-CM | POA: Diagnosis not present

## 2015-02-26 DIAGNOSIS — Z1321 Encounter for screening for nutritional disorder: Secondary | ICD-10-CM | POA: Diagnosis not present

## 2015-02-26 DIAGNOSIS — R0683 Snoring: Secondary | ICD-10-CM | POA: Diagnosis not present

## 2015-02-26 DIAGNOSIS — Z01411 Encounter for gynecological examination (general) (routine) with abnormal findings: Secondary | ICD-10-CM | POA: Diagnosis not present

## 2015-02-26 DIAGNOSIS — Z1151 Encounter for screening for human papillomavirus (HPV): Secondary | ICD-10-CM | POA: Diagnosis not present

## 2015-02-26 DIAGNOSIS — Z6841 Body Mass Index (BMI) 40.0 and over, adult: Secondary | ICD-10-CM | POA: Diagnosis not present

## 2015-02-26 LAB — HEMOGLOBIN A1C: HEMOGLOBIN A1C: 6.1

## 2015-02-26 LAB — HEPATIC FUNCTION PANEL
ALK PHOS: 121 U/L (ref 25–125)
ALT: 45 U/L — AB (ref 7–35)
AST: 39 U/L — AB (ref 13–35)

## 2015-02-26 LAB — BASIC METABOLIC PANEL
BUN: 10 mg/dL (ref 4–21)
Creatinine: 0.6 mg/dL (ref 0.5–1.1)
GLUCOSE: 105 mg/dL
Potassium: 4.1 mmol/L (ref 3.4–5.3)
Sodium: 141 mmol/L (ref 137–147)

## 2015-02-26 LAB — HM PAP SMEAR: HM Pap smear: NEGATIVE

## 2015-02-26 LAB — VITAMIN D 25 HYDROXY (VIT D DEFICIENCY, FRACTURES): Vit D, 25-Hydroxy: <4

## 2015-02-26 LAB — TSH: TSH: 2.6 u[IU]/mL (ref 0.41–5.90)

## 2015-03-06 IMAGING — CT CT HEAD W/O CM
1 series · 16 of 30 positions shown, 20 images · non-contrast
Comparison: None.

CLINICAL DATA: Severe headache, elevated blood pressure

EXAM:
CT HEAD WITHOUT CONTRAST
TECHNIQUE: Contiguous axial images were obtained from the base of the skull
through the vertex without intravenous contrast.

[Series 2: head 5.0 h30s · axial · 0.42mm/px · z∈[+1001,+1146]mm · 16 of 33 slices shown, 20 images]
[im 2/33  brain]
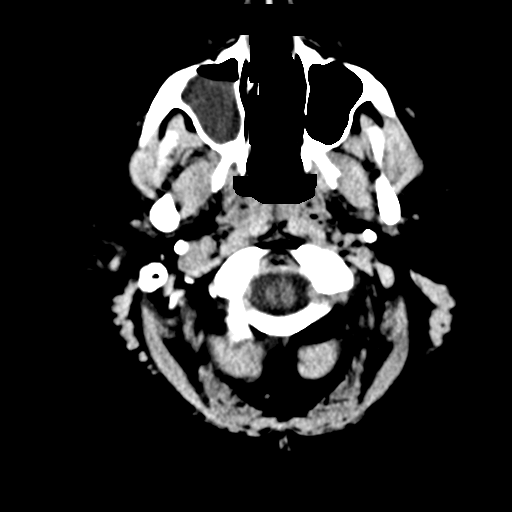
[im 2/33  bone]
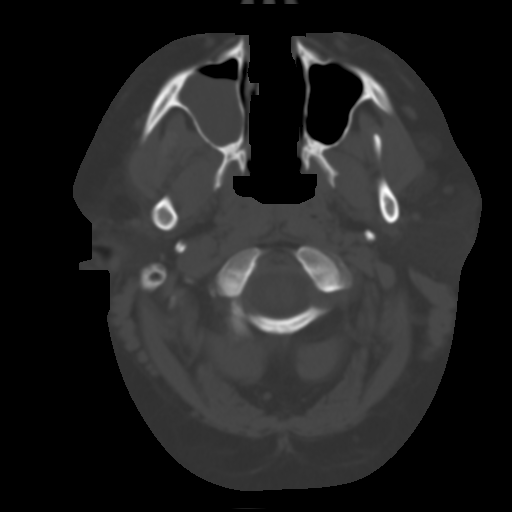
[im 4/33  brain]
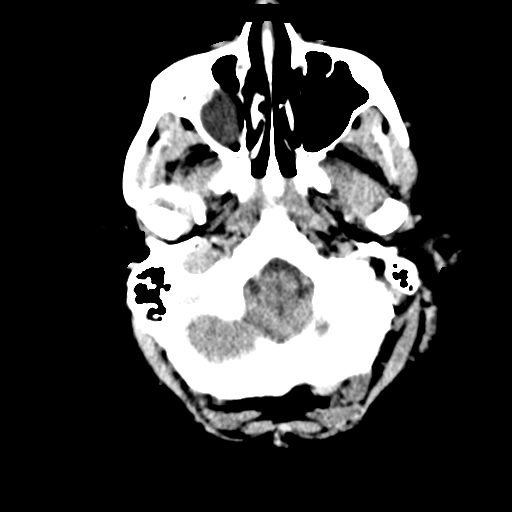
[im 6/33  brain]
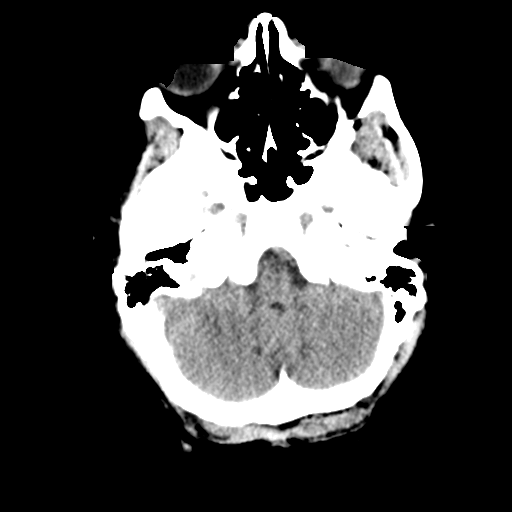
[im 8/33  brain]
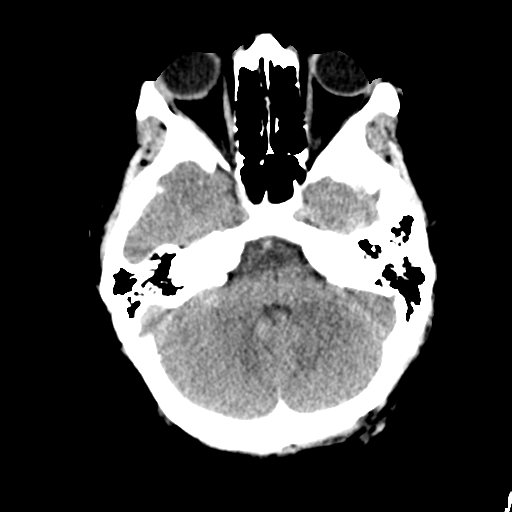
[im 9/33  brain]
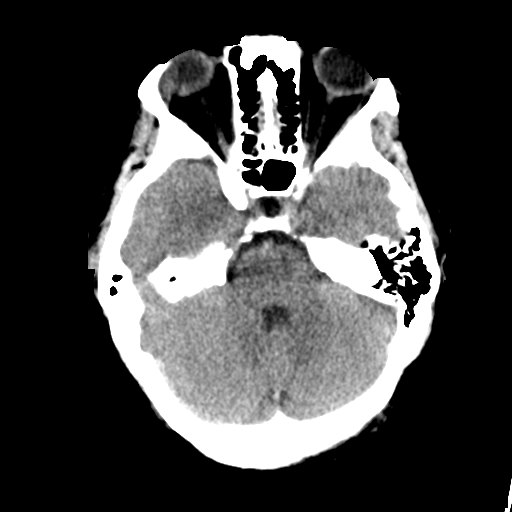
[im 9/33  bone]
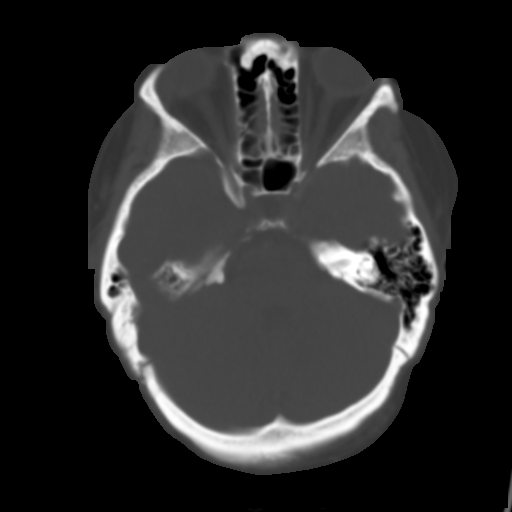
[im 12/33  brain]
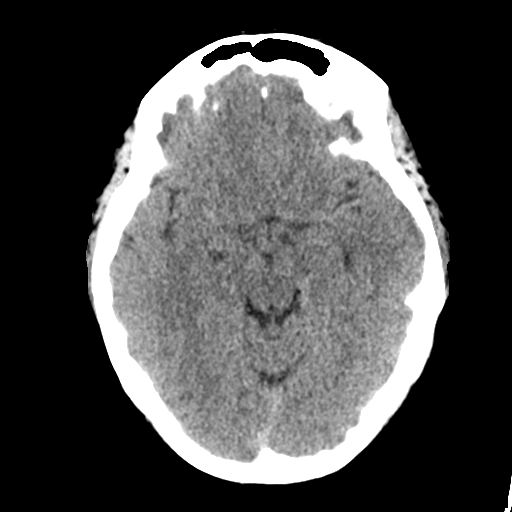
[im 14/33  brain]
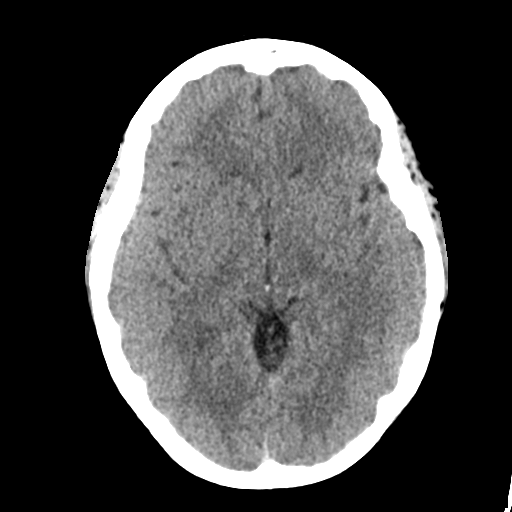
[im 16/33  brain]
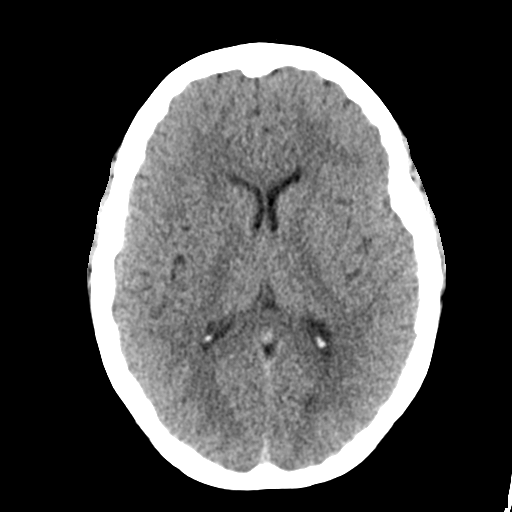
[im 17/33  brain]
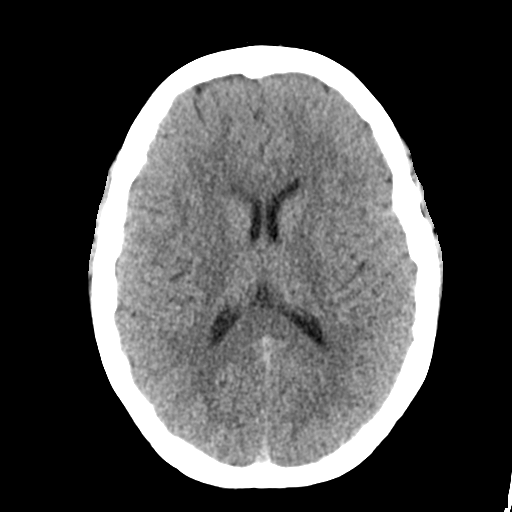
[im 17/33  bone]
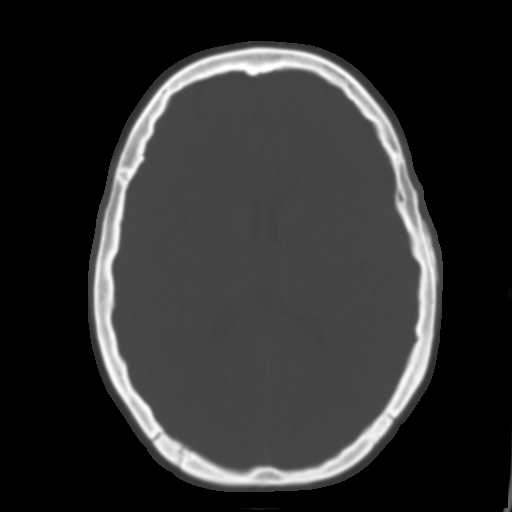
[im 19/33  brain]
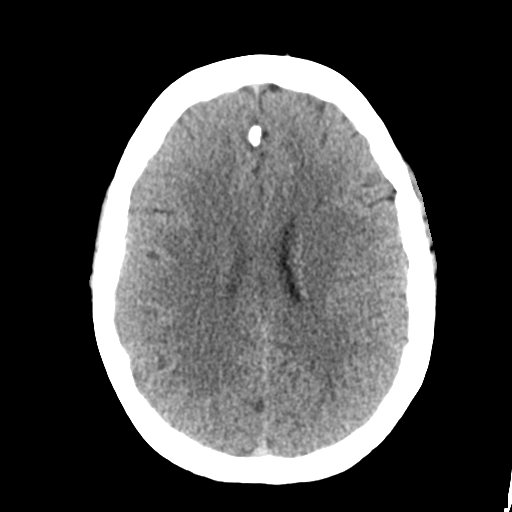
[im 21/33  brain]
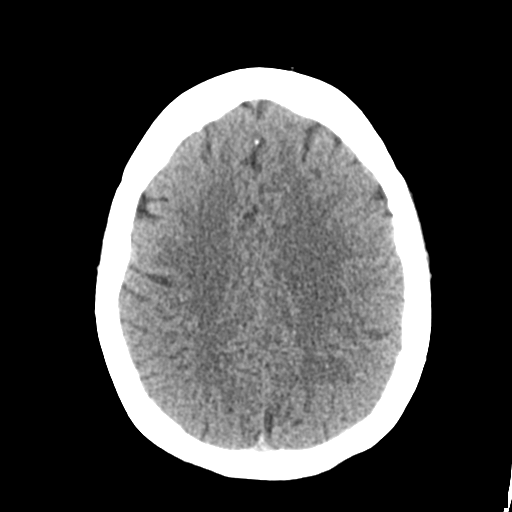
[im 24/33  brain]
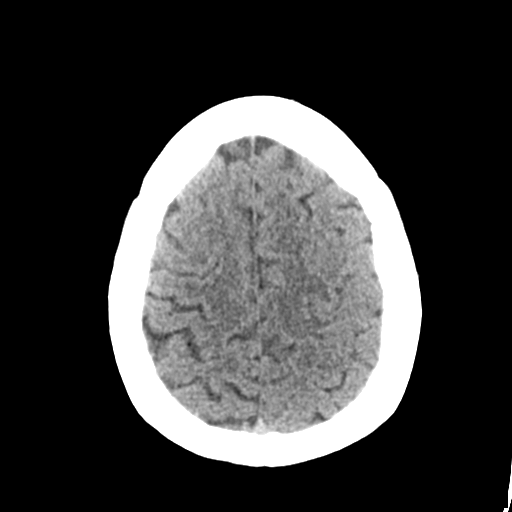
[im 25/33  brain]
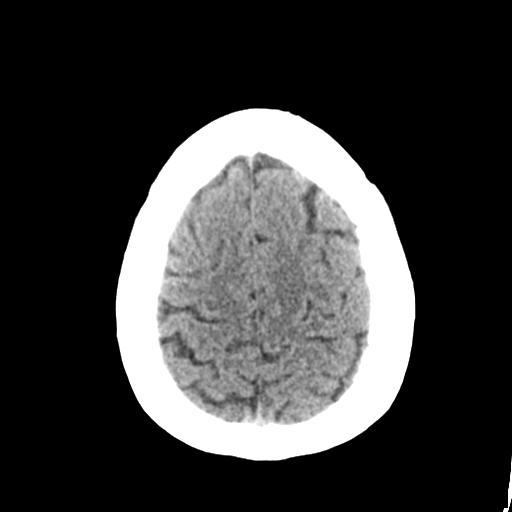
[im 25/33  bone]
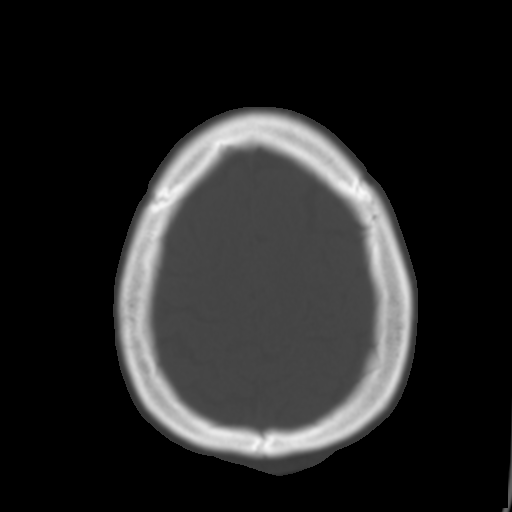
[im 27/33  brain]
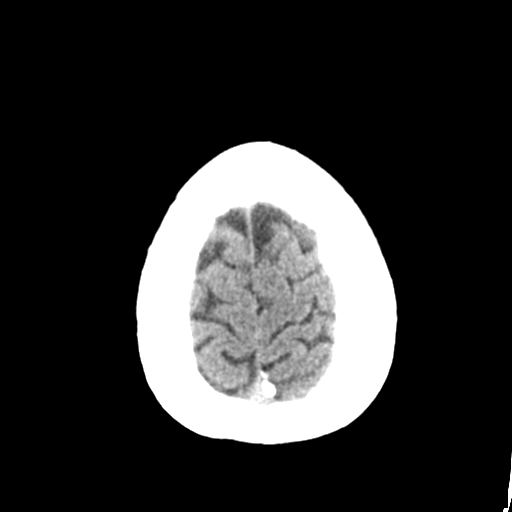
[im 29/33  brain]
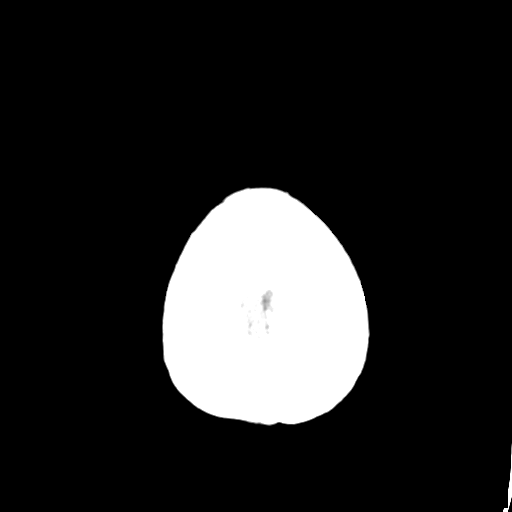
[im 31/33  brain]
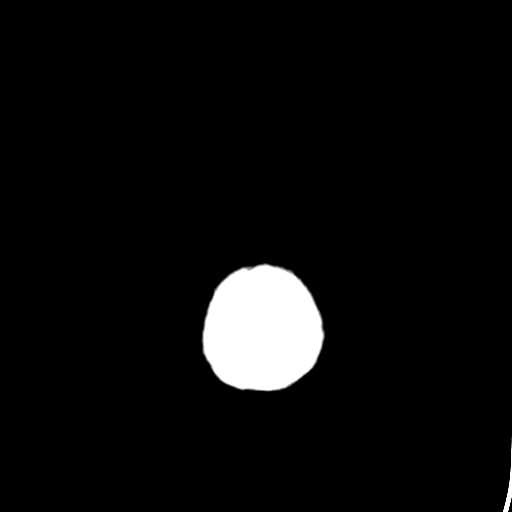

[16 of 30 positions shown; findings below may reference images not displayed]

FINDINGS: Near complete opacification of the right maxillary sinus. The
remainder of the visualized sinuses are clear. No evidence of
hemorrhage or extra-axial fluid. No vascular territory infarct,
mass, or hydrocephalus.
IMPRESSION: Significant inflammatory change right maxillary sinus. Otherwise
negative.

## 2015-03-07 ENCOUNTER — Encounter: Payer: Self-pay | Admitting: Physician Assistant

## 2015-03-12 MED FILL — AMLODIPINE BESYLATE 10 MG T: 10 | 30 days supply | Qty: 30 | Fill #0

## 2015-03-12 MED FILL — VIT D2 1.25 MG (50,000 UNIT: 1.25 MG | 84 days supply | Qty: 12 | Fill #0

## 2015-03-13 ENCOUNTER — Encounter: Payer: Self-pay | Admitting: Physician Assistant

## 2015-04-03 MED FILL — MEDROXYPROGESTERONE 10 MG T: 10 | 10 days supply | Qty: 20 | Fill #0

## 2015-04-18 ENCOUNTER — Ambulatory Visit (INDEPENDENT_AMBULATORY_CARE_PROVIDER_SITE_OTHER): Payer: 59 | Admitting: Physician Assistant

## 2015-04-18 ENCOUNTER — Encounter: Payer: Self-pay | Admitting: Physician Assistant

## 2015-04-18 VITALS — BP 131/83 | HR 76 | Wt >= 6400 oz

## 2015-04-18 DIAGNOSIS — E785 Hyperlipidemia, unspecified: Secondary | ICD-10-CM

## 2015-04-18 DIAGNOSIS — Z6841 Body Mass Index (BMI) 40.0 and over, adult: Secondary | ICD-10-CM | POA: Diagnosis not present

## 2015-04-18 DIAGNOSIS — I10 Essential (primary) hypertension: Secondary | ICD-10-CM | POA: Diagnosis not present

## 2015-04-18 MED ORDER — AMLODIPINE BESYLATE 10 MG PO TABS: 10.0000 mg | ORAL_TABLET | Freq: Every day | ORAL | Status: DC

## 2015-04-18 MED ORDER — ATORVASTATIN CALCIUM 40 MG PO TABS: 40.0000 mg | ORAL_TABLET | Freq: Every day | ORAL | Status: DC

## 2015-04-18 MED FILL — ATORVASTATIN 40 MG TABLET: 40 | 90 days supply | Qty: 90 | Fill #0

## 2015-04-18 MED FILL — AMLODIPINE BESYLATE 10 MG T: 10 | 90 days supply | Qty: 90 | Fill #0

## 2015-04-18 NOTE — Progress Notes (Addendum)
   Subjective:    Patient ID: Valerie Aguilar, female    DOB: May 19, 1980, 35 y.o.   MRN: [id number]  HPI  Pt is a 35 yo female who presents to the clinic for HTN follow up. She denies any CP, palpitations, headaches, dizziness, SOB. She is taking norvasc daily without problems.   She has hyperlipidemia but lost her meds after 1 week of being on lipitor and never called back to get refilled.   She needs paperwork filled out for weight loss surgery. She is not a candidate for phentermine. She has cut caffiene out of her diet. She struggles with any exercise due to joint pain. She has pre-diabetes, hyperlipidemia, HTN. Over the past 2 years she has tried OTC supplements and low carb/high protien diets without any benefits.    Review of Systems  All other systems reviewed and are negative.      Objective:   Physical Exam  Constitutional: She is oriented to person, place, and time. She appears well-developed and well-nourished.  Morbid obesity.   HENT:  Head: Normocephalic and atraumatic.  Cardiovascular: Normal rate, regular rhythm and normal heart sounds.   Pulmonary/Chest: Effort normal and breath sounds normal. She has no wheezes.  Neurological: She is alert and oriented to person, place, and time.  Skin: Skin is dry.  Psychiatric: She has a normal mood and affect. Her behavior is normal.          Assessment & Plan:  HTN- controlled. Refilled norvasc.   Hyperlipidemia- start lipitor and then follow up for recheck in 3 months. Last LDL was 188.   Morbid obesity- BMI today is 63.5 with commodities. See letter and paperwork filled out. She is seeing [facility] for gastric sleeve.   Spent 30 minutes with patient filling out paperwork for bariatric surgery.

## 2015-04-21 ENCOUNTER — Other Ambulatory Visit (HOSPITAL_COMMUNITY): Payer: Self-pay | Admitting: General Surgery

## 2015-04-21 DIAGNOSIS — Z6841 Body Mass Index (BMI) 40.0 and over, adult: Principal | ICD-10-CM

## 2015-04-21 DIAGNOSIS — E559 Vitamin D deficiency, unspecified: Secondary | ICD-10-CM | POA: Insufficient documentation

## 2015-04-21 NOTE — Addendum Note (Signed)
Addended by: Donella Stade on: 04/21/2015 02:04 PM   Modules accepted: Level of Service

## 2015-04-22 ENCOUNTER — Encounter: Payer: Self-pay | Admitting: *Deleted

## 2015-04-23 ENCOUNTER — Other Ambulatory Visit: Payer: Self-pay

## 2015-04-23 ENCOUNTER — Ambulatory Visit (HOSPITAL_COMMUNITY)
Admission: RE | Admit: 2015-04-23 | Discharge: 2015-04-23 | Disposition: A | Discharge status: Home / Self Care | Payer: 59 | Source: Ambulatory Visit | Attending: General Surgery | Admitting: General Surgery

## 2015-04-23 DIAGNOSIS — Z01818 Encounter for other preprocedural examination: Secondary | ICD-10-CM | POA: Diagnosis not present

## 2015-04-23 DIAGNOSIS — Z6841 Body Mass Index (BMI) 40.0 and over, adult: Secondary | ICD-10-CM | POA: Diagnosis not present

## 2015-04-28 DIAGNOSIS — N913 Primary oligomenorrhea: Secondary | ICD-10-CM | POA: Diagnosis not present

## 2015-05-09 ENCOUNTER — Other Ambulatory Visit (HOSPITAL_COMMUNITY): Payer: Self-pay | Admitting: General Surgery

## 2015-05-21 DIAGNOSIS — N914 Secondary oligomenorrhea: Secondary | ICD-10-CM | POA: Diagnosis not present

## 2015-05-21 DIAGNOSIS — R938 Abnormal findings on diagnostic imaging of other specified body structures: Secondary | ICD-10-CM | POA: Diagnosis not present

## 2015-05-21 DIAGNOSIS — N8501 Benign endometrial hyperplasia: Secondary | ICD-10-CM | POA: Diagnosis not present

## 2015-05-21 LAB — LIPID PANEL
Cholesterol: 141 mg/dL (ref 0–200)
HDL: 34 mg/dL — AB (ref 35–70)
LDL CALC: 86 mg/dL
Triglycerides: 106 mg/dL (ref 40–160)

## 2015-05-30 ENCOUNTER — Encounter: Payer: 59 | Attending: General Surgery | Admitting: Dietician

## 2015-05-30 ENCOUNTER — Encounter: Payer: Self-pay | Admitting: Dietician

## 2015-05-30 DIAGNOSIS — Z01818 Encounter for other preprocedural examination: Secondary | ICD-10-CM | POA: Insufficient documentation

## 2015-05-30 NOTE — Patient Instructions (Signed)
Follow Pre-Op Goals Try Protein Shakes Call [facility] at [phone] when surgery is scheduled to enroll in Pre-Op Class  Things to remember:  Please always be honest with [place]. We want to support you!  If you have any questions or concerns in between appointments, please call or email Ferol Luz, or Margarita Grizzle.  The diet after surgery will be high protein and low in carbohydrate.  Vitamins and calcium need to be taken for the rest of your life.  Feel free to include support people in any classes or appointments.   Supplement recommendations:  "Complete" Multivitamin: Sleeve Gastrectomy and RYGB patients take a double dose of MVI. Vitamin must be liquid or chewable but not gummy. Examples of these include Flintstones Complete and Centrum Complete. If the vitamin is bariatric-specific, take 1 dose as it is already formulated for bariatric surgery patients. Examples of these are Bariatric Advantage, Celebrate, and [employer]. These can be found at the [facility] and/or online.     Calcium citrate: 1500 mg/day of Calcium citrate (also chewable or liquid) is recommended for all procedures. The body is only able to absorb 500-600 mg of Calcium at one time so 3 daily doses of 500 mg are recommended. Calcium doses must be taken a minimum of 2 hours apart. Additionally, Calcium must be taken 2 hours apart from iron-containing MVI. Examples of brands include Celebrate, Bariatric Advantage, and Wellesse. These brands must be purchased online or at the [facility]. Citracal Petites is the only Calcium citrate supplement found in general grocery stores and pharmacies. This is in tablet form and may be recommended for patients who do not tolerate chewable Calcium.  Continued or added Vitamin D supplementation based on individual needs.    Vitamin B12: 300-500 mcg/day for Sleeve Gastrectomy and RYGB. Must be taken intramuscularly, sublingually, or inhaled nasally. Oral route  is not recommended.

## 2015-05-30 NOTE — Progress Notes (Signed)
  Pre-Op Assessment Visit:  Pre-Operative Sleeve Gastrectomy Surgery  Medical Nutrition Therapy:  Appt start time: [id number]   End time:  1100.  Patient was seen on 05/30/2015 for Pre-Operative Nutrition Assessment. Assessment and letter of approval faxed to [facility] Bariatric Surgery Program coordinator on 05/30/2015.   Preferred Learning Style:   No preference indicated   Learning Readiness:   Ready  Handouts given during visit include:  Pre-Op Goals Bariatric Surgery Protein Shakes   During the appointment today the following Pre-Op Goals were reviewed with the patient: Maintain or lose weight as instructed by your surgeon Make healthy food choices Begin to limit portion sizes Limited concentrated sugars and fried foods Keep fat/sugar in the single digits per serving on   food labels Practice CHEWING your food  (aim for 30 chews per bite or until applesauce consistency) Practice not drinking 15 minutes before, during, and 30 minutes after each meal/snack Avoid all carbonated beverages  Avoid/limit caffeinated beverages  Avoid all sugar-sweetened beverages Consume 3 meals per day; eat every 3-5 hours Make a list of non-food related activities Aim for 64-100 ounces of FLUID daily  Aim for at least 60-80 grams of PROTEIN daily Look for a liquid protein source that contain ?15 g protein and ?5 g carbohydrate  (ex: shakes, drinks, shots)  Patient-Centered Goals: -Participate in lives of nieces and nephews -Having children someday -Overall health and mobility  Demonstrated degree of understanding via:  Teach Back  Teaching Method Utilized:  Visual Auditory Hands on  Barriers to learning/adherence to lifestyle change: none  Patient to call the [facility] to enroll in Pre-Op and Post-Op Nutrition Education when surgery date is scheduled.

## 2015-06-06 MED FILL — VIT D2 1.25 MG (50,000 UNIT: 1.25 MG | 84 days supply | Qty: 12 | Fill #1

## 2015-06-08 ENCOUNTER — Encounter: Payer: Self-pay | Admitting: Emergency Medicine

## 2015-06-08 ENCOUNTER — Emergency Department
Admission: EM | Admit: 2015-06-08 | Discharge: 2015-06-08 | Disposition: A | Discharge status: Home / Self Care | Payer: 59 | Source: Home / Self Care | Attending: Family Medicine | Admitting: Family Medicine

## 2015-06-08 DIAGNOSIS — S0501XA Injury of conjunctiva and corneal abrasion without foreign body, right eye, initial encounter: Secondary | ICD-10-CM

## 2015-06-08 MED ORDER — OFLOXACIN 0.3 % OP SOLN: 2.0000 [drp] | Freq: Four times a day (QID) | OPHTHALMIC | Status: DC

## 2015-06-08 NOTE — ED Provider Notes (Signed)
CSN: [id number]     Arrival date & time 06/08/15  1346 History   First MD Initiated Contact with Patient 06/08/15 1410     Chief Complaint  Patient presents with  . Eye Injury   (Consider location/radiation/quality/duration/timing/severity/associated sxs/prior Treatment) HPI  The pt is a 35yo female presenting to [facility] with c/o Right eye pain and decreased vision after accidentally poking herself in the eye yesterday.  Pt states her eye keeps watering and it is painful to open her eye. She does not wear contacts or glasses. Pain is aching and sore, 8/10 at worst but improves to 4/10 with ibuprofen. She tried irrigating her eye yesterday but no relief of discomfort. Pt c/o FB sensation in her eye.  Denies recent illness of cough, congestion or fever.   Past Medical History  Diagnosis Date  . Obesity, morbid ([place])   . Hyperlipidemia   . Hypertension    Past Surgical History  Procedure Laterality Date  . Tonsillectomy    . Wisdom tooth extraction     Family History  Problem Relation Age of Onset  . COPD Mother   . Hypertension Father   . Diabetes Father   . Stroke Paternal Aunt   . Cancer Paternal Uncle   . Diabetes Paternal Grandmother   . Hypertension Paternal Grandmother    Social History  Substance Use Topics  . Smoking status: Never Smoker   . Smokeless tobacco: Never Used  . Alcohol Use: No   OB History    No data available     Review of Systems  Constitutional: Negative for fever and chills.  HENT: Negative for congestion and rhinorrhea.   Eyes: Positive for photophobia, pain, discharge ( watery), redness and visual disturbance. Negative for itching.  Neurological: Negative for dizziness, light-headedness and headaches.    Allergies  Lisinopril  Home Medications   Prior to Admission medications   Medication Sig Start Date End Date Taking? Authorizing Provider  amLODipine (NORVASC) 10 MG tablet Take 1 tablet (10 mg total) by mouth daily. 04/18/15   Jade L  Breeback, PA-C  atorvastatin (LIPITOR) 40 MG tablet Take 1 tablet (40 mg total) by mouth daily. 04/18/15   Jade L Breeback, PA-C  ergocalciferol (VITAMIN D2) 50000 units capsule Take 50,000 Units by mouth once a week.    Historical Provider, MD  ofloxacin (OCUFLOX) 0.3 % ophthalmic solution Place 2 drops into the right eye 4 (four) times daily. 06/08/15   Noland Fordyce, PA-C   Meds Ordered and Administered this Visit  Medications - No data to display  BP 139/99 mmHg  Pulse 90  Temp(Src) 98.5 F (36.9 C) (Oral)  Resp 16  Ht 5\' 7"  (1.702 m)  Wt 407 lb 12 oz (184.954 kg)  BMI 63.85 kg/m2  SpO2 94%  LMP 05/07/2015 No data found.   Physical Exam  Constitutional: She is oriented to person, place, and time. She appears well-developed and well-nourished.  HENT:  Head: Normocephalic and atraumatic.  Mouth/Throat: Oropharynx is clear and moist.  Eyes: EOM and lids are normal. Pupils are equal, round, and reactive to light. Right conjunctiva is injected.  Fundoscopic exam:      The right eye shows no hemorrhage and no papilledema.  Right eye: water. EOMs normal. No periorbital erythema, edema, or tenderness. No hyphema.  Right eye- fluorescein uptake near center of cornea, just inferior to pupil c/w corneal abrasion  Neck: Normal range of motion.  Cardiovascular: Normal rate.   Pulmonary/Chest: Effort normal.  Musculoskeletal: Normal  range of motion.  Neurological: She is alert and oriented to person, place, and time.  Skin: Skin is warm and dry.  Psychiatric: She has a normal mood and affect. Her behavior is normal.  Nursing note and vitals reviewed.   ED Course  Procedures (including critical care time)  Labs Review Labs Reviewed - No data to display  Imaging Review No results found.   Visual Acuity Review                                                       Recheck after tetracaine used for fluorescein test Right Eye Distance: 20/200                   20/50        Left  Eye Distance: 20/20                     20/20 Bilateral Distance: 20/20                     20/20  Right Eye Near:   Left Eye Near:    Bilateral Near:      MDM   1. Injury of conjunctiva and corneal abrasion of eye without foreign body, right, initial encounter    Pt c/o Right eye pain, watering and decreased vision after accidentally poking herself in the eye last night.  Initial visual acuity: 20/200 in Right, improved to 20/50 after eye exam.  Consulted with Dr. Katy Fitch, ophthalmology, agrees to see pt in office tomorrow for further evaluation.  Recommends ofloxacin QID for Right eye.  Pt agreeable with plan.     Noland Fordyce, PA-C 06/08/15 1533

## 2015-06-08 NOTE — ED Notes (Signed)
Right eye flushed with NS

## 2015-06-08 NOTE — ED Notes (Signed)
Patient advised that she jabbed herself in the right eye yesterday. Eye is red and watery.

## 2015-06-08 NOTE — Discharge Instructions (Signed)

## 2015-06-09 DIAGNOSIS — S0501XA Injury of conjunctiva and corneal abrasion without foreign body, right eye, initial encounter: Secondary | ICD-10-CM | POA: Diagnosis not present

## 2015-06-11 DIAGNOSIS — B372 Candidiasis of skin and nail: Secondary | ICD-10-CM | POA: Diagnosis not present

## 2015-06-11 MED FILL — MEDROXYPROGESTERONE 10 MG T: 10 | 30 days supply | Qty: 60 | Fill #0

## 2015-06-12 ENCOUNTER — Telehealth: Payer: Self-pay | Admitting: *Deleted

## 2015-06-12 DIAGNOSIS — S0501XA Injury of conjunctiva and corneal abrasion without foreign body, right eye, initial encounter: Secondary | ICD-10-CM | POA: Diagnosis not present

## 2015-06-12 NOTE — ED Notes (Signed)
Callback: Pt reports she f/u'd with opthalmology x 2. Her eye is improved.

## 2015-06-18 ENCOUNTER — Other Ambulatory Visit: Payer: Self-pay | Admitting: Obstetrics

## 2015-06-19 DIAGNOSIS — S0501XA Injury of conjunctiva and corneal abrasion without foreign body, right eye, initial encounter: Secondary | ICD-10-CM | POA: Diagnosis not present

## 2015-06-20 NOTE — Patient Instructions (Addendum)
Your procedure is scheduled on:  Monday, July 07, 2015  Enter through the Main Entrance of [facility] at:  1:45 PM  Pick up the phone at the desk and dial [phone].  Call this number if you have problems the morning of surgery: [phone].  Remember: Do NOT eat food:  After Midnight Sunday, July 06, 2015  Do NOT drink clear liquids after:  11:00 AM day of surgery  Take these medicines the morning of surgery with a SIP OF WATER:  Amlodipine  Do NOT wear jewelry (body piercing), metal hair clips/bobby pins, make-up, or nail polish. Do NOT wear lotions, powders, or perfumes.  You may wear deodorant. Do NOT shave for 48 hours prior to surgery. Do NOT bring valuables to the hospital. Contacts, dentures, or bridgework may not be worn into surgery.  Have a responsible adult drive you home and stay with you for 24 hours after your procedure

## 2015-06-22 ENCOUNTER — Ambulatory Visit (HOSPITAL_BASED_OUTPATIENT_CLINIC_OR_DEPARTMENT_OTHER): Payer: 59

## 2015-06-23 ENCOUNTER — Encounter (HOSPITAL_COMMUNITY): Payer: Self-pay

## 2015-06-23 ENCOUNTER — Encounter (HOSPITAL_COMMUNITY)
Admission: RE | Admit: 2015-06-23 | Discharge: 2015-06-23 | Disposition: A | Discharge status: Home / Self Care | Payer: 59 | Source: Ambulatory Visit | Attending: Obstetrics | Admitting: Obstetrics

## 2015-06-23 DIAGNOSIS — Z01812 Encounter for preprocedural laboratory examination: Secondary | ICD-10-CM | POA: Insufficient documentation

## 2015-06-23 HISTORY — DX: Other injuries of right eye and orbit, initial encounter: S05.8X1A

## 2015-06-23 LAB — CBC
HCT: 43.7 % (ref 36.0–46.0)
Hemoglobin: 14.6 g/dL (ref 12.0–15.0)
MCH: 27 pg (ref 26.0–34.0)
MCHC: 33.4 g/dL (ref 30.0–36.0)
MCV: 80.9 fL (ref 78.0–100.0)
PLATELETS: 278 10*3/uL (ref 150–400)
RBC: 5.4 MIL/uL — ABNORMAL HIGH (ref 3.87–5.11)
RDW: 13.7 % (ref 11.5–15.5)
WBC: 11.8 10*3/uL — ABNORMAL HIGH (ref 4.0–10.5)

## 2015-06-23 LAB — BASIC METABOLIC PANEL
ANION GAP: 7 (ref 5–15)
BUN: 9 mg/dL (ref 6–20)
CALCIUM: 9 mg/dL (ref 8.9–10.3)
CO2: 24 mmol/L (ref 22–32)
Chloride: 105 mmol/L (ref 101–111)
Creatinine, Ser: 0.65 mg/dL (ref 0.44–1.00)
Glucose, Bld: 213 mg/dL — ABNORMAL HIGH (ref 65–99)
POTASSIUM: 4 mmol/L (ref 3.5–5.1)
Sodium: 136 mmol/L (ref 135–145)

## 2015-06-23 NOTE — Pre-Procedure Instructions (Signed)
Dr. Smith Robert made aware of Ms. Eschbach elevated glucose at pre op appointment.  We will perform a fingerstick CBG day of surgery.

## 2015-06-27 ENCOUNTER — Encounter: Payer: Self-pay | Admitting: Physician Assistant

## 2015-07-06 MED ORDER — DEXTROSE 5 % IV SOLN
100.0000 mg | Freq: Once | INTRAVENOUS | Status: AC
  Administered 2015-07-07: 100 mg via INTRAVENOUS
  Filled 2015-07-06: qty 100

## 2015-07-07 ENCOUNTER — Encounter (HOSPITAL_COMMUNITY): Payer: Self-pay | Admitting: *Deleted

## 2015-07-07 ENCOUNTER — Ambulatory Visit (HOSPITAL_COMMUNITY)
Admission: RE | Admit: 2015-07-07 | Discharge: 2015-07-07 | Disposition: A | Discharge status: Home / Self Care | Payer: 59 | Source: Ambulatory Visit | Attending: Obstetrics | Admitting: Obstetrics

## 2015-07-07 ENCOUNTER — Encounter (HOSPITAL_COMMUNITY)
Admission: RE | Disposition: A | Discharge status: Home / Self Care | Payer: Self-pay | Source: Ambulatory Visit | Attending: Obstetrics

## 2015-07-07 ENCOUNTER — Ambulatory Visit (HOSPITAL_COMMUNITY): Payer: 59 | Admitting: Anesthesiology

## 2015-07-07 DIAGNOSIS — N8501 Benign endometrial hyperplasia: Secondary | ICD-10-CM | POA: Insufficient documentation

## 2015-07-07 HISTORY — PX: DILATATION & CURETTAGE/HYSTEROSCOPY WITH MYOSURE: SHX6511

## 2015-07-07 LAB — GLUCOSE, CAPILLARY: GLUCOSE-CAPILLARY: 126 mg/dL — AB (ref 65–99)

## 2015-07-07 LAB — PREGNANCY, URINE: Preg Test, Ur: NEGATIVE

## 2015-07-07 SURGERY — DILATATION & CURETTAGE/HYSTEROSCOPY WITH MYOSURE
Anesthesia: General | Site: Uterus

## 2015-07-07 MED ORDER — PROPOFOL 10 MG/ML IV BOLUS
INTRAVENOUS | Status: DC | PRN
  Administered 2015-07-07: 100 mg via INTRAVENOUS
  Administered 2015-07-07: 50 mg via INTRAVENOUS
  Administered 2015-07-07: 200 mg via INTRAVENOUS

## 2015-07-07 MED ORDER — SUCCINYLCHOLINE CHLORIDE 20 MG/ML IJ SOLN
INTRAMUSCULAR | Status: DC | PRN
  Administered 2015-07-07: 140 mg via INTRAVENOUS

## 2015-07-07 MED ORDER — ROCURONIUM BROMIDE 100 MG/10ML IV SOLN
INTRAVENOUS | Status: AC
  Filled 2015-07-07: qty 1

## 2015-07-07 MED ORDER — BUPIVACAINE HCL (PF) 0.5 % IJ SOLN
INTRAMUSCULAR | Status: AC
  Filled 2015-07-07: qty 30

## 2015-07-07 MED ORDER — SCOPOLAMINE 1 MG/3DAYS TD PT72
1.0000 | MEDICATED_PATCH | Freq: Once | TRANSDERMAL | Status: DC
Start: 2015-07-07 — End: 2015-07-07
  Administered 2015-07-07: 1.5 mg via TRANSDERMAL

## 2015-07-07 MED ORDER — ONDANSETRON HCL 4 MG/2ML IJ SOLN
INTRAMUSCULAR | Status: DC | PRN
  Administered 2015-07-07: 4 mg via INTRAVENOUS

## 2015-07-07 MED ORDER — MIDAZOLAM HCL 2 MG/2ML IJ SOLN
INTRAMUSCULAR | Status: DC | PRN
  Administered 2015-07-07: 1 mg via INTRAVENOUS

## 2015-07-07 MED ORDER — BUPIVACAINE HCL 0.5 % IJ SOLN
INTRAMUSCULAR | Status: DC | PRN
  Administered 2015-07-07: 10 mL

## 2015-07-07 MED ORDER — FENTANYL CITRATE (PF) 100 MCG/2ML IJ SOLN
INTRAMUSCULAR | Status: DC
Start: 2015-07-07 — End: 2015-07-07
  Filled 2015-07-07: qty 2

## 2015-07-07 MED ORDER — LACTATED RINGERS IV SOLN
INTRAVENOUS | Status: DC
  Administered 2015-07-07: 17:00:00 via INTRAVENOUS
  Administered 2015-07-07 (×2): 1000 mL via INTRAVENOUS

## 2015-07-07 MED ORDER — CHLOROPROCAINE HCL 1 % IJ SOLN
INTRAMUSCULAR | Status: AC
  Filled 2015-07-07: qty 30

## 2015-07-07 MED ORDER — LIDOCAINE HCL (CARDIAC) 20 MG/ML IV SOLN
INTRAVENOUS | Status: AC
  Filled 2015-07-07: qty 5

## 2015-07-07 MED ORDER — PROPOFOL 10 MG/ML IV BOLUS
INTRAVENOUS | Status: AC
  Filled 2015-07-07: qty 20

## 2015-07-07 MED ORDER — FENTANYL CITRATE (PF) 100 MCG/2ML IJ SOLN
25.0000 ug | INTRAMUSCULAR | Status: DC | PRN
  Administered 2015-07-07 (×2): 50 ug via INTRAVENOUS

## 2015-07-07 MED ORDER — FERRIC SUBSULFATE 259 MG/GM EX SOLN
CUTANEOUS | Status: AC
  Filled 2015-07-07: qty 8

## 2015-07-07 MED ORDER — SILVER NITRATE-POT NITRATE 75-25 % EX MISC
CUTANEOUS | Status: DC | PRN
  Administered 2015-07-07: 1

## 2015-07-07 MED ORDER — VASOPRESSIN 20 UNIT/ML IV SOLN
INTRAVENOUS | Status: AC
  Filled 2015-07-07: qty 1

## 2015-07-07 MED ORDER — ROCURONIUM BROMIDE 100 MG/10ML IV SOLN
INTRAVENOUS | Status: DC | PRN
  Administered 2015-07-07: 5 mg via INTRAVENOUS

## 2015-07-07 MED ORDER — MIDAZOLAM HCL 2 MG/2ML IJ SOLN
INTRAMUSCULAR | Status: AC
  Filled 2015-07-07: qty 2

## 2015-07-07 MED ORDER — SUCCINYLCHOLINE CHLORIDE 20 MG/ML IJ SOLN
INTRAMUSCULAR | Status: AC
  Filled 2015-07-07: qty 1

## 2015-07-07 MED ORDER — ONDANSETRON HCL 4 MG/2ML IJ SOLN
INTRAMUSCULAR | Status: AC
  Filled 2015-07-07: qty 2

## 2015-07-07 MED ORDER — OXYCODONE-ACETAMINOPHEN 5-325 MG PO TABS: 2.0000 | ORAL_TABLET | ORAL | Status: DC | PRN

## 2015-07-07 MED ORDER — FENTANYL CITRATE (PF) 100 MCG/2ML IJ SOLN
INTRAMUSCULAR | Status: AC
  Filled 2015-07-07: qty 2

## 2015-07-07 MED ORDER — PROMETHAZINE HCL 25 MG/ML IJ SOLN: 6.2500 mg | INTRAMUSCULAR | Status: DC | PRN

## 2015-07-07 MED ORDER — FERRIC SUBSULFATE 259 MG/GM EX SOLN
CUTANEOUS | Status: DC | PRN
  Administered 2015-07-07: 1

## 2015-07-07 MED ORDER — FENTANYL CITRATE (PF) 100 MCG/2ML IJ SOLN
INTRAMUSCULAR | Status: DC | PRN
  Administered 2015-07-07 (×2): 12.5 ug via INTRAVENOUS
  Administered 2015-07-07: 50 ug via INTRAVENOUS
  Administered 2015-07-07: 25 ug via INTRAVENOUS

## 2015-07-07 MED ORDER — SODIUM CHLORIDE 0.9 % IR SOLN
Status: DC | PRN
  Administered 2015-07-07: 3000 mL

## 2015-07-07 MED ORDER — SCOPOLAMINE 1 MG/3DAYS TD PT72
MEDICATED_PATCH | TRANSDERMAL | Status: AC
  Filled 2015-07-07: qty 1

## 2015-07-07 MED ORDER — LIDOCAINE HCL (CARDIAC) 20 MG/ML IV SOLN
INTRAVENOUS | Status: DC | PRN
  Administered 2015-07-07: 60 mg via INTRAVENOUS

## 2015-07-07 SURGICAL SUPPLY — 22 items
BIPOLAR CUTTING LOOP 21FR (ELECTRODE)
CANISTER SUCT 3000ML (MISCELLANEOUS) ×4 IMPLANT
CATH ROBINSON RED A/P 16FR (CATHETERS) ×4 IMPLANT
CLOTH BEACON ORANGE TIMEOUT ST (SAFETY) ×4 IMPLANT
CONTAINER PREFILL 10% NBF 60ML (FORM) ×8 IMPLANT
DEVICE MYOSURE REACH (MISCELLANEOUS) ×4 IMPLANT
ELECT REM PT RETURN 9FT ADLT (ELECTROSURGICAL)
ELECTRODE REM PT RTRN 9FT ADLT (ELECTROSURGICAL) IMPLANT
GLOVE BIO SURGEON STRL SZ 6.5 (GLOVE) ×3 IMPLANT
GLOVE BIO SURGEONS STRL SZ 6.5 (GLOVE) ×1
GLOVE BIOGEL PI IND STRL 7.0 (GLOVE) ×4 IMPLANT
GLOVE BIOGEL PI INDICATOR 7.0 (GLOVE) ×4
GOWN STRL REUS W/TWL LRG LVL3 (GOWN DISPOSABLE) ×8 IMPLANT
LOOP CUTTING BIPOLAR 21FR (ELECTRODE) IMPLANT
PACK VAGINAL MINOR WOMEN LF (CUSTOM PROCEDURE TRAY) ×4 IMPLANT
PAD OB MATERNITY 4.3X12.25 (PERSONAL CARE ITEMS) ×4 IMPLANT
SEAL ROD LENS SCOPE MYOSURE (ABLATOR) ×4 IMPLANT
STENT BALLN UTERINE 4CM 6FR (STENTS) IMPLANT
TOWEL OR 17X24 6PK STRL BLUE (TOWEL DISPOSABLE) ×8 IMPLANT
TUBING AQUILEX INFLOW (TUBING) ×4 IMPLANT
TUBING AQUILEX OUTFLOW (TUBING) ×4 IMPLANT
WATER STERILE IRR 1000ML POUR (IV SOLUTION) ×4 IMPLANT

## 2015-07-07 NOTE — OR Nursing (Signed)
Notified Dr. Pamala Hurry that the fluid deficit is 360 mls.

## 2015-07-07 NOTE — Transfer of Care (Signed)
Immediate Anesthesia Transfer of Care Note  Patient: Valerie Aguilar  Procedure(s) Performed: Procedure(s): DILATATION & CURETTAGE/HYSTEROSCOPY WITH RESECTOCOPE (N/A)  Patient Location: PACU  Anesthesia Type:General  Level of Consciousness: sedated  Airway & Oxygen Therapy: Patient Spontanous Breathing  Post-op Assessment: Report given to RN  Post vital signs: Reviewed and stable  Last Vitals:  Filed Vitals:   07/07/15 1400  BP: 135/88  Pulse: 105  Temp: 37.1 C  Resp: 24    Last Pain: There were no vitals filed for this visit.    Patients Stated Pain Goal: 4 ([id number])  Complications: No apparent anesthesia complications

## 2015-07-07 NOTE — H&P (Signed)
CC: complex endometrial hyperplasia  HPI: 35 yo G0 with irreg menses, thick endometrium on u/s and ebx with complex endometrial hyperplasia. Pt has continued no Provera and no further bleeding. Here for D&C, hysteroscopy to eval for more serious pathology to better guide treatment.  Past Medical History  Diagnosis Date  . Obesity, morbid ([place])   . Hyperlipidemia   . Hypertension   . Abrasion of right eye     irregular 06/07/2015 uses antibiotics, course completed    Past Surgical History  Procedure Laterality Date  . Tonsillectomy    . Wisdom tooth extraction      All: lisinopril  PE;  Filed Vitals:   07/07/15 1400  BP: 135/88  Pulse: 105  Temp: 98.7 F (37.1 C)  TempSrc: Oral  Resp: 24  SpO2: 98%   Gen: obese, no distress Abd: obese GU: def to OR LE: NT, no edema  CBC    Component Value Date/Time   WBC 11.8* 06/23/2015 1555   RBC 5.40* 06/23/2015 1555   HGB 14.6 06/23/2015 1555   HCT 43.7 06/23/2015 1555   PLT 278 06/23/2015 1555   MCV 80.9 06/23/2015 1555   MCH 27.0 06/23/2015 1555   MCHC 33.4 06/23/2015 1555   RDW 13.7 06/23/2015 1555   LYMPHSABS 2.4 06/04/2013 1127   MONOABS 0.3 06/04/2013 1127   EOSABS 0.1 06/04/2013 1127   BASOSABS 0.0 06/04/2013 1127    A/P: 35 yo G0 morbidly obese with complex hyperplasia for D&C, hysteroscopy.  R/B d/w pt, esp increased anasthesia risks given obesity. Will likely need post-op progesterone suppression, plan IUD post-op if no cancer.  Natashia Roseman A. 07/07/2015 3:55 PM

## 2015-07-07 NOTE — Brief Op Note (Signed)
07/07/2015  5:12 PM  PATIENT:  Valerie Aguilar  35 y.o. female  PRE-OPERATIVE DIAGNOSIS:  Complex Endometrial Hyperplasia  POST-OPERATIVE DIAGNOSIS:  Complex Endometrial Hyperplasia  PROCEDURE:  Procedure(s): DILATATION & CURETTAGE/HYSTEROSCOPY WITH RESECTOCOPE (N/A)  SURGEON:  Surgeon(s) and Role:    * Aloha Gell, MD - Primary  PHYSICIAN ASSISTANT: none  ASSISTANTS: none   ANESTHESIA:   general and local  EBL:  Total I/O In: -  Out: 125 [Urine:100; Blood:25]  BLOOD ADMINISTERED:none  DRAINS: none   LOCAL MEDICATIONS USED:  MARCAINE     SPECIMEN:  Source of Specimen:  endometrial currettings  DISPOSITION OF SPECIMEN:  PATHOLOGY  COUNTS:  YES  TOURNIQUET:  * No tourniquets in log *  DICTATION: .Note written in EPIC  PLAN OF CARE: home  PATIENT DISPOSITION:  PACU - hemodynamically stable.   Delay start of Pharmacological VTE agent (>24hrs) due to surgical blood loss or risk of bleeding: yes

## 2015-07-07 NOTE — Op Note (Signed)
07/07/2015  5:12 PM  PATIENT:  Valerie Aguilar  35 y.o. female  PRE-OPERATIVE DIAGNOSIS:  Complex Endometrial Hyperplasia  POST-OPERATIVE DIAGNOSIS:  Complex Endometrial Hyperplasia  PROCEDURE:  Procedure(s): DILATATION & CURETTAGE/HYSTEROSCOPY WITH RESECTOCOPE (N/A)  SURGEON:  Surgeon(s) and Role:    * Aloha Gell, MD - Primary  PHYSICIAN ASSISTANT: none  ASSISTANTS: none   ANESTHESIA:   general and local  EBL:  Total I/O In: -  Out: 125 [Urine:100; Blood:25]  BLOOD ADMINISTERED:none  DRAINS: none   LOCAL MEDICATIONS USED:  MARCAINE     SPECIMEN:  Source of Specimen:  endometrial currettings  DISPOSITION OF SPECIMEN:  PATHOLOGY  COUNTS:  YES  TOURNIQUET:  * No tourniquets in log *  DICTATION: .Note written in EPIC  PLAN OF CARE: home  PATIENT DISPOSITION:  PACU - hemodynamically stable.   Delay start of Pharmacological VTE agent (>24hrs) due to surgical blood loss or risk of bleeding: yes     Abx: 100mg  IV doxycycline  Findings: non visualization of bilateral ostia, hemostasis post-procedure, thick bands of hypeplastic endometrial tissue  Indications: obesity, thickened endometrial strip, complex hyperplasia   After informed consent including discussion of risks of bleeding, infection, perforation,  the patient was taken to the operating room where general anesthesia was initiated without difficulty. She was prepped and draped in normal sterile fashion in the dorsal supine lithotomy position.  A bimanual examination was done to assess the size and position of the uterus. A speculum was placed in the vagina and single tooth tenaculum used to grasp the anterior lip of the cervix. Local - 0.5% marcaine, 20cc split between 5 and 7 o'clock in there cervico-paracervical junction.   The cervix was then serially dilated to a #23 Pratt dilator. The hysteroscope was inserted. Survey of the endometrium/ pathology with findings as above. The myosure blade was then  placed through the operating channel, suction was applied and the thickened tissue was morcellated. The ostia were never visualized due to the amount of endometrial tissue present. Given pt's habitus and the high risks a/w perforation and need to convert to laparscopy, decision was made to obtain tissue for sampling but that increased risks did not need to be taken in order to visualize ostia. Hemostasis was noted.  The hysteroscope was then removed. Tenaculum was removed. The tenaculum site was hemostatic with monsels and the case was terminated. The patient tolerated the procedure well. Sponge, lap and needle counts were correct and the patient was taken to the recovery room in stable condition.   Jazelyn Sipe A. 07/07/2015 5:13 PM

## 2015-07-07 NOTE — Anesthesia Procedure Notes (Signed)
Procedure Name: Intubation Date/Time: 07/07/2015 4:24 PM Performed by: Casimer Lanius A Pre-anesthesia Checklist: Patient identified, Patient being monitored, Timeout performed, Emergency Drugs available and Suction available Patient Re-evaluated:Patient Re-evaluated prior to inductionOxygen Delivery Method: Circle System Utilized Preoxygenation: Pre-oxygenation with 100% oxygen Intubation Type: IV induction and Inhalational induction Ventilation: Two handed mask ventilation required Laryngoscope Size: Mac and 3 Grade View: Grade III Tube type: Oral Tube size: 7.0 mm Number of attempts: 1 Airway Equipment and Method: stylet and Patient positioned with wedge pillow Placement Confirmation: ETT inserted through vocal cords under direct vision,  positive ETCO2 and breath sounds checked- equal and bilateral Secured at: 21 cm Tube secured with: Tape Dental Injury: Teeth and Oropharynx as per pre-operative assessment

## 2015-07-07 NOTE — Discharge Instructions (Signed)
Post Anesthesia Home Care Instructions  Activity: Get plenty of rest for the remainder of the day. A responsible adult should stay with you for 24 hours following the procedure.  For the next 24 hours, DO NOT: -Drive a car -Paediatric nurse  DISCHARGE INSTRUCTIONS: D&C The following instructions have been prepared to help you care for yourself upon your return home.   Personal hygiene:  Use sanitary pads for vaginal drainage, not tampons.  Shower the day after your procedure.  NO tub baths, pools or Jacuzzis for 2-3 weeks.  Wipe front to back after using the bathroom.  Activity and limitations:  Do NOT drive or operate any equipment for 24 hours. The effects of anesthesia are still present and drowsiness may result.  Do NOT rest in bed all day.  Walking is encouraged.  Walk up and down stairs slowly.  You may resume your normal activity in one to two days or as indicated by your physician.  Sexual activity: NO intercourse for at least 2 weeks after the procedure, or as indicated by your physician.  Diet: Eat a light meal as desired this evening. You may resume your usual diet tomorrow.  Return to work: You may resume your work activities in one to two days or as indicated by your doctor.  What to expect after your surgery: Expect to have vaginal bleeding/discharge for 2-3 days and spotting for up to 10 days. It is not unusual to have soreness for up to 1-2 weeks. You may have a slight burning sensation when you urinate for the first day. Mild cramps may continue for a couple of days. You may have a regular period in 2-6 weeks.  Call your doctor for any of the following:  Excessive vaginal bleeding, saturating and changing one pad every hour.  Inability to urinate 6 hours after discharge from hospital.  Pain not relieved by pain medication.  Fever of 100.4 F or greater.  Unusual vaginal discharge or odor.   Call for an appointment:    Patients signature:  ______________________  Nurses signature ________________________  Support person's signature_______________________    Post Anesthesia Home Care Instructions  Activity: Get plenty of rest for the remainder of the day. A responsible adult should stay with you for 24 hours following the procedure.  For the next 24 hours, DO NOT: -Drive a car -Paediatric nurse -Drink alcoholic beverages -Take any medication unless instructed by your physician -Make any legal decisions or sign important papers.  Meals: Start with liquid foods such as gelatin or soup. Progress to regular foods as tolerated. Avoid greasy, spicy, heavy foods. If nausea and/or vomiting occur, drink only clear liquids until the nausea and/or vomiting subsides. Call your physician if vomiting continues.  Special Instructions/Symptoms: Your throat may feel dry or sore from the anesthesia or the breathing tube placed in your throat during surgery. If this causes discomfort, gargle with warm salt water. The discomfort should disappear within 24 hours.  If you had a scopolamine patch placed behind your ear for the management of post- operative nausea and/or vomiting:  1. The medication in the patch is effective for 72 hours, after which it should be removed.  Wrap patch in a tissue and discard in the trash. Wash hands thoroughly with soap and water. 2. You may remove the patch earlier than 72 hours if you experience unpleasant side effects which may include dry mouth, dizziness or visual disturbances. 3. Avoid touching the patch. Wash your hands with soap and water after  contact with the patch.

## 2015-07-07 NOTE — Anesthesia Postprocedure Evaluation (Signed)
Anesthesia Post Note  Patient: Valerie Aguilar  Procedure(s) Performed: Procedure(s) (LRB): DILATATION & CURETTAGE/HYSTEROSCOPY WITH MYOSURE (N/A)  Patient location during evaluation: PACU Anesthesia Type: General Level of consciousness: awake and alert and oriented Pain management: pain level controlled Vital Signs Assessment: post-procedure vital signs reviewed and stable Respiratory status: spontaneous breathing, nonlabored ventilation and respiratory function stable Cardiovascular status: blood pressure returned to baseline and stable Postop Assessment: no signs of nausea or vomiting Anesthetic complications: no     Last Vitals:  Filed Vitals:   07/07/15 1800 07/07/15 1815  BP: 120/72 120/78  Pulse: 91 93  Temp: 36.8 C   Resp: 14 14    Last Pain:  Filed Vitals:   07/07/15 1833  PainSc: 3    Pain Goal: Patients Stated Pain Goal: 4 (07/07/15 1815)               Holdan Stucke A.

## 2015-07-07 NOTE — Anesthesia Preprocedure Evaluation (Signed)
Anesthesia Evaluation  Patient identified by MRN, date of birth, ID band Patient awake    Reviewed: Allergy & Precautions, NPO status , Patient's Chart, lab work & pertinent test results  Airway Mallampati: II  TM Distance: >3 FB Neck ROM: Full    Dental no notable dental hx.    Pulmonary neg pulmonary ROS,    Pulmonary exam normal breath sounds clear to auscultation       Cardiovascular hypertension, Pt. on medications Normal cardiovascular exam Rhythm:Regular Rate:Normal     Neuro/Psych negative neurological ROS  negative psych ROS   GI/Hepatic negative GI ROS, Neg liver ROS,   Endo/Other  Morbid obesity  Renal/GU negative Renal ROS  negative genitourinary   Musculoskeletal negative musculoskeletal ROS (+)   Abdominal (+) + obese,   Peds negative pediatric ROS (+)  Hematology negative hematology ROS (+)   Anesthesia Other Findings   Reproductive/Obstetrics negative OB ROS                             Anesthesia Physical Anesthesia Plan  ASA: III  Anesthesia Plan: General   Post-op Pain Management:    Induction: Intravenous  Airway Management Planned: Oral ETT  Additional Equipment:   Intra-op Plan:   Post-operative Plan: Extubation in OR  Informed Consent: I have reviewed the patients History and Physical, chart, labs and discussed the procedure including the risks, benefits and alternatives for the proposed anesthesia with the patient or authorized representative who has indicated his/her understanding and acceptance.   Dental advisory given  Plan Discussed with: CRNA  Anesthesia Plan Comments:         Anesthesia Quick Evaluation

## 2015-07-08 ENCOUNTER — Encounter (HOSPITAL_COMMUNITY): Payer: Self-pay | Admitting: Obstetrics

## 2015-07-14 MED FILL — MEDROXYPROGESTERONE 10 MG T: 10 | 30 days supply | Qty: 60 | Fill #0

## 2015-07-16 DIAGNOSIS — C541 Malignant neoplasm of endometrium: Secondary | ICD-10-CM | POA: Diagnosis not present

## 2015-07-17 ENCOUNTER — Encounter (HOSPITAL_COMMUNITY): Payer: Self-pay

## 2015-07-21 ENCOUNTER — Encounter: Payer: Self-pay | Admitting: Physician Assistant

## 2015-07-21 ENCOUNTER — Encounter: Payer: Self-pay | Admitting: Gynecologic Oncology

## 2015-07-21 ENCOUNTER — Ambulatory Visit: Payer: 59 | Attending: Gynecologic Oncology | Admitting: Gynecologic Oncology

## 2015-07-21 VITALS — BP 142/91 | HR 82 | Temp 98.5°F | Resp 19 | Ht 67.0 in | Wt >= 6400 oz

## 2015-07-21 DIAGNOSIS — I10 Essential (primary) hypertension: Secondary | ICD-10-CM | POA: Insufficient documentation

## 2015-07-21 DIAGNOSIS — C541 Malignant neoplasm of endometrium: Secondary | ICD-10-CM

## 2015-07-21 DIAGNOSIS — N979 Female infertility, unspecified: Secondary | ICD-10-CM | POA: Insufficient documentation

## 2015-07-21 DIAGNOSIS — R7303 Prediabetes: Secondary | ICD-10-CM | POA: Diagnosis not present

## 2015-07-21 DIAGNOSIS — Z823 Family history of stroke: Secondary | ICD-10-CM | POA: Insufficient documentation

## 2015-07-21 DIAGNOSIS — Z836 Family history of other diseases of the respiratory system: Secondary | ICD-10-CM | POA: Insufficient documentation

## 2015-07-21 DIAGNOSIS — Z809 Family history of malignant neoplasm, unspecified: Secondary | ICD-10-CM | POA: Diagnosis not present

## 2015-07-21 DIAGNOSIS — E785 Hyperlipidemia, unspecified: Secondary | ICD-10-CM | POA: Diagnosis not present

## 2015-07-21 DIAGNOSIS — Z6841 Body Mass Index (BMI) 40.0 and over, adult: Secondary | ICD-10-CM | POA: Diagnosis not present

## 2015-07-21 DIAGNOSIS — Z8249 Family history of ischemic heart disease and other diseases of the circulatory system: Secondary | ICD-10-CM | POA: Insufficient documentation

## 2015-07-21 DIAGNOSIS — N920 Excessive and frequent menstruation with regular cycle: Secondary | ICD-10-CM | POA: Insufficient documentation

## 2015-07-21 DIAGNOSIS — Z833 Family history of diabetes mellitus: Secondary | ICD-10-CM | POA: Insufficient documentation

## 2015-07-21 DIAGNOSIS — Z888 Allergy status to other drugs, medicaments and biological substances status: Secondary | ICD-10-CM | POA: Insufficient documentation

## 2015-07-21 NOTE — Progress Notes (Signed)
Consult Note: Gyn-Onc  Consult was requested by Dr. Pamala Hurry for the evaluation of Valerie Aguilar 35 y.o. female  CC:  Chief Complaint  Patient presents with  . endometrial cancer    New Consultation    Assessment/Plan:  Valerie Aguilar  is a 35 y.o.  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  She strongly desires future fertility.  I discussed that the gold standard approach for endometrial cancer is hysterectomy, bso and staging. However, in cases such as hers where fertility preservation is desired it is reasonable to consider fertility sparing options including administration of progestins. This has an approximately 25% likelihood of success, but can result in control/stabilization of the process and improvement in bleeding symptoms. I discussed the importance of serial surveillance with repeated endometrial sampling every 3 months during this course of treatment.  The added benefit of progestin therapy for this patient would be to allow for her to go forward with bariatric surgery. At present, with a BMI of 64, the surgical risk she faces with a deep pelvic surgery such as hysterectomy is extremely high. However, is she is successful in losing weight, this risk is significantly mitigated. I also discussed the beneficial role weight loss will have in likely assisting her to spontaneously ovulate, improve fertility, and make any future pregnancy safer. Weight loss will also increase the likelihood of regression of her endometrial cancer as this cancer is largely driven by obesity and obesity related growth factors.  I discussed that [facility] has a clinical trial exploring the addition of metformin to progestin releasing IUD in the non-surgical treatment of endometrial cancer. The patient will consider being part of this trial. If she  elects not to be part of the trial, she will follow-up with Dr Pamala Hurry as previously scheduled for IUD placement in approximately 1 week. I recommend 3 monthly surveillance biopsies during this time. If her cancer has not regressed by 6 months, I recommend adding additional oral progestin coverage (megace 80 BID).  If by 1 year there is no sign of regression of the malignancy, I recommend strongly considering hysterectomy.   There should be no delay to her proceeding with her bariatric surgery as this will be helpful in both the medical and surgical management of her endometrial cancer.  HPI: Valerie Aguilar is a very pleasant 35 year old G0 who is seen in consultation at the request of Dr Pamala Hurry for endometrial cancer (FIGO grade 1, endometrioid). The patient reports a history of oligo menorrhagia. She also has significant morbid obesity with a BMI 64 kg meters squared. She has primary infertility. She is prediabetes with an HbA1c of 6.1.  The patient underwent a transvaginal ultrasound scan on 05/21/2015 as part of workup of her abnormal menses and infertility. It revealed a uterus measuring 8.6 x 5.4 x 4.5 cm. The endometrium was irregular with tissue within. An endometrial biopsy was performed in the office on 05/21/2015 and revealed fragments of proliferative endometrium with a focus of complex non-typical hyperplasia. This prompted further evaluation the operating room with a D&C which was performed on 07/07/2015. This revealed well-differentiated endometrial adenocarcinoma FIGO grade 1, endometrioid type arising in a background of extensive simple to complex hyperplasia.  The patient strongly desires future fertility. She has extreme morbid obesity which is being managed with the plan for bariatric surgery with Dr.  Hoxworth. A potential surgery date is imminent although she is unclear of how this will change given this current diagnosis.  Current Meds:  Outpatient Encounter Prescriptions as of  07/21/2015  Medication Sig  . amLODipine (NORVASC) 10 MG tablet Take 1 tablet (10 mg total) by mouth daily.  Marland Kitchen atorvastatin (LIPITOR) 40 MG tablet Take 1 tablet (40 mg total) by mouth daily.  . ergocalciferol (VITAMIN D2) 50000 units capsule Take 50,000 Units by mouth once a week. saturdays  . medroxyPROGESTERone (PROVERA) 10 MG tablet Take 20 mg by mouth daily.  . [DISCONTINUED] oxyCODONE-acetaminophen (ROXICET) 5-325 MG tablet Take 2 tablets by mouth every 4 (four) hours as needed for severe pain.   No facility-administered encounter medications on file as of 07/21/2015.    Allergy:  Allergies  Allergen Reactions  . Lisinopril     Dry cough    Social Hx:   Social History   Social History  . Marital Status: Married    Spouse Name: N/A  . Number of Children: N/A  . Years of Education: N/A   Occupational History  . Not on file.   Social History Main Topics  . Smoking status: Never Smoker   . Smokeless tobacco: Never Used  . Alcohol Use: No  . Drug Use: No  . Sexual Activity: Yes    Birth Control/ Protection: None   Other Topics Concern  . Not on file   Social History Narrative    Past Surgical Hx:  Past Surgical History  Procedure Laterality Date  . Tonsillectomy    . Wisdom tooth extraction    . Dilatation & curettage/hysteroscopy with myosure N/A 07/07/2015    Procedure: DILATATION & CURETTAGE/HYSTEROSCOPY WITH MYOSURE;  Surgeon: Aloha Gell, MD;  Location: [facility];  Service: Gynecology;  Laterality: N/A;    Past Medical Hx:  Past Medical History  Diagnosis Date  . Obesity, morbid ([place])   . Hyperlipidemia   . Hypertension   . Abrasion of right eye     irregular 06/07/2015 uses antibiotics, course completed    Past Gynecological History:  Oligomenorrhagia, primary infertility  No LMP recorded. Patient is not currently having periods (Reason: Other).  Family Hx:  Family History  Problem Relation Age of Onset  . COPD Mother   . Hypertension Father   .  Diabetes Father   . Stroke Paternal Aunt   . Cancer Paternal Uncle   . Diabetes Paternal Grandmother   . Hypertension Paternal Grandmother     Review of Systems:  Constitutional  Feels well,    ENT Normal appearing ears and nares bilaterally Skin/Breast  No rash, sores, jaundice, itching, dryness Cardiovascular  No chest pain, shortness of breath, or edema  Pulmonary  No cough or wheeze.  Gastro Intestinal  No nausea, vomitting, or diarrhoea. No bright red blood per rectum, no abdominal pain, change in bowel movement, or constipation.  Genito Urinary  No frequency, urgency, dysuria, + abnormal uterine bleeding and infrequent menses Musculo Skeletal  No myalgia, arthralgia, joint swelling or pain  Neurologic  No weakness, numbness, change in gait,  Psychology  No depression, anxiety, insomnia.   Vitals:  Blood pressure 142/91, pulse 82, temperature 98.5 F (36.9 C), temperature source Oral, resp. rate 19, height 5\' 7"  (1.702 m), weight 404 lb 14.4 oz (183.661 kg), SpO2 99 %.  Physical Exam: WD in NAD Neck  Supple NROM, without any enlargements.  Lymph Node Survey No cervical supraclavicular or inguinal adenopathy Cardiovascular  Pulse normal rate, regularity and  rhythm. S1 and S2 normal.  Lungs  Clear to auscultation bilateraly, without wheezes/crackles/rhonchi. Good air movement.  Skin  No rash/lesions/breakdown  Psychiatry  Alert and oriented to person, place, and time  Abdomen  Normoactive bowel sounds, abdomen soft, non-tender and obese without evidence of hernia.  Back No CVA tenderness Genito Urinary  Vulva/vagina: Normal external female genitalia.  No lesions. No discharge or bleeding.  Bladder/urethra:  No lesions or masses, well supported bladder  Vagina: normal  Cervix: Normal appearing, no lesions. Easily visualized with regular size speculum.  Uterus: difficult to discern due to body habitus, mobile, no parametrial involvement or  nodularity.  Adnexa: no palpable masses. Rectal  deferred  Extremities  No bilateral cyanosis, clubbing or edema.   Donaciano Eva, MD  07/21/2015, 11:16 AM  CC: Dr Pamala Hurry, Dr Excell Seltzer, Dr Alden Hipp

## 2015-07-21 NOTE — Patient Instructions (Signed)
Follow up with Dr Emilia Beck for IUD placement , call if you decide to follow through with the IUD trial at [facility] Call with any additional questions or concerns.  Thank you!

## 2015-07-25 DIAGNOSIS — H04123 Dry eye syndrome of bilateral lacrimal glands: Secondary | ICD-10-CM | POA: Diagnosis not present

## 2015-07-25 DIAGNOSIS — H17821 Peripheral opacity of cornea, right eye: Secondary | ICD-10-CM | POA: Diagnosis not present

## 2015-07-25 DIAGNOSIS — S0501XA Injury of conjunctiva and corneal abrasion without foreign body, right eye, initial encounter: Secondary | ICD-10-CM | POA: Diagnosis not present

## 2015-07-28 ENCOUNTER — Encounter: Payer: 59 | Attending: General Surgery

## 2015-07-28 DIAGNOSIS — Z01818 Encounter for other preprocedural examination: Secondary | ICD-10-CM | POA: Insufficient documentation

## 2015-07-28 DIAGNOSIS — Z3200 Encounter for pregnancy test, result unknown: Secondary | ICD-10-CM | POA: Diagnosis not present

## 2015-07-28 DIAGNOSIS — Z30430 Encounter for insertion of intrauterine contraceptive device: Secondary | ICD-10-CM | POA: Diagnosis not present

## 2015-07-30 NOTE — Progress Notes (Signed)
  Pre-Operative Nutrition Class:  Appt start time: 0254   End time:  1830.  Patient was seen on 07/28/2015 for Pre-Operative Bariatric Surgery Education at [facility].   Surgery date:  Surgery type: sleeve gastrectomy [facility] weight at [facility]: 409 lbs on 05/30/2015 Weight today: 408.1 lbs  TANITA  BODY COMP RESULTS  07/28/15   BMI (kg/m^2) N/A   Fat Mass (lbs)    Fat Free Mass (lbs)    Total Body Water (lbs)    Samples given per MNT protocol. Patient educated on appropriate usage: Bariatric Advantage Calcium Citrate chew (chocolate - qty 1) Lot #: [id number] Exp: 11/2015  Unjury Protein Powder (strawberry - qty 1) Lot #: [id number] Exp: 07/2016  Premier protein shake (vanilla - qty 1) Lot#: [id number] Exp: 03/2016  The following the learning objectives were met by the patient during this course:  Identify Pre-Op Dietary Goals and will begin 2 weeks pre-operatively  Identify appropriate sources of fluids and proteins   State protein recommendations and appropriate sources pre and post-operatively  Identify Post-Operative Dietary Goals and will follow for 2 weeks post-operatively  Identify appropriate multivitamin and calcium sources  Describe the need for physical activity post-operatively and will follow MD recommendations  State when to call healthcare provider regarding medication questions or post-operative complications  Handouts given during class include:  Pre-Op Bariatric Surgery Diet Handout  Protein Shake Handout  Post-Op Bariatric Surgery Nutrition Handout  BELT Program Information Flyer  Support Group Information Flyer  [facility] Bariatric Supplements Price List  Follow-Up Plan: Patient will follow-up at [facility] 2 weeks post operatively for diet advancement per MD.

## 2015-08-11 NOTE — Progress Notes (Signed)
Please place orders in EPIC as patient has a pre-op appointment with nurse on 08/20/2015 at 10 am! Thank you!

## 2015-08-13 ENCOUNTER — Other Ambulatory Visit: Payer: Self-pay | Admitting: General Surgery

## 2015-08-15 ENCOUNTER — Other Ambulatory Visit: Payer: Self-pay | Admitting: General Surgery

## 2015-08-18 NOTE — Progress Notes (Signed)
Chest 4/17 epic

## 2015-08-19 NOTE — Patient Instructions (Addendum)
Valerie Aguilar  08/19/2015   Your procedure is scheduled on: 08-26-15  Report to [facility] Main  Entrance take [facility]  elevators to 3rd floor to  [place] at 745 AM.  Call this number if you have problems the morning of surgery [phone]   Remember: ONLY 1 PERSON MAY GO WITH YOU TO SHORT STAY TO GET  READY MORNING OF [name].  Do not eat food or drink liquids :After Midnight.     Take these medicines the morning of surgery with A SIP OF WATER: amlodipine                                You may not have any metal on your body including hair pins and              piercings  Do not wear jewelry, make-up, lotions, powders or perfumes, deodorant             Do not wear nail polish.  Do not shave  48 hours prior to surgery.              Men may shave face and neck.   Do not bring valuables to the hospital. [name].  Contacts, dentures or bridgework may not be worn into surgery.  Leave suitcase in the car. After surgery it may be brought to your room.             [name] - Preparing for Surgery Before surgery, you can play an important role.  Because skin is not sterile, your skin needs to be as free of germs as possible.  You can reduce the number of germs on your skin by washing with CHG (chlorahexidine gluconate) soap before surgery.  CHG is an antiseptic cleaner which kills germs and bonds with the skin to continue killing germs even after washing. Please DO NOT use if you have an allergy to CHG or antibacterial soaps.  If your skin becomes reddened/irritated stop using the CHG and inform your nurse when you arrive at Short Stay. Do not shave (including legs and underarms) for at least 48 hours prior to the first CHG shower.  You may shave your face/neck. Please follow these instructions carefully:  1.  Shower with CHG Soap the night before surgery and the  morning of Surgery.  2.  If you  choose to wash your hair, wash your hair first as usual with your  normal  shampoo.  3.  After you shampoo, rinse your hair and body thoroughly to remove the  shampoo.                           4.  Use CHG as you would any other liquid soap.  You can apply chg directly  to the skin and wash                       Gently with a scrungie or clean washcloth.  5.  Apply the CHG Soap to your body ONLY FROM THE NECK DOWN.   Do not use on face/ open  Wound or open sores. Avoid contact with eyes, ears mouth and genitals (private parts).                       Wash face,  Genitals (private parts) with your normal soap.             6.  Wash thoroughly, paying special attention to the area where your surgery  will be performed.  7.  Thoroughly rinse your body with warm water from the neck down.  8.  DO NOT shower/wash with your normal soap after using and rinsing off  the CHG Soap.                9.  Pat yourself dry with a clean towel.            10.  Wear clean pajamas.            11.  Place clean sheets on your bed the night of your first shower and do not  sleep with pets. Day of Surgery : Do not apply any lotions/deodorants the morning of surgery.  Please wear clean clothes to the hospital/surgery center.  FAILURE TO FOLLOW THESE INSTRUCTIONS MAY RESULT IN THE CANCELLATION OF YOUR SURGERY PATIENT SIGNATURE_________________________________  NURSE SIGNATURE__________________________________  ________________________________________________________________________

## 2015-08-20 ENCOUNTER — Encounter (HOSPITAL_COMMUNITY)
Admission: RE | Admit: 2015-08-20 | Discharge: 2015-08-20 | Disposition: A | Discharge status: Home / Self Care | Payer: 59 | Source: Ambulatory Visit | Attending: General Surgery | Admitting: General Surgery

## 2015-08-20 ENCOUNTER — Encounter (HOSPITAL_COMMUNITY): Payer: Self-pay

## 2015-08-20 DIAGNOSIS — Z6841 Body Mass Index (BMI) 40.0 and over, adult: Secondary | ICD-10-CM | POA: Insufficient documentation

## 2015-08-20 DIAGNOSIS — Z01812 Encounter for preprocedural laboratory examination: Secondary | ICD-10-CM | POA: Insufficient documentation

## 2015-08-20 HISTORY — DX: Malignant (primary) neoplasm, unspecified: C80.1

## 2015-08-20 HISTORY — DX: Unspecified osteoarthritis, unspecified site: M19.90

## 2015-08-20 LAB — COMPREHENSIVE METABOLIC PANEL
ALBUMIN: 4.3 g/dL (ref 3.5–5.0)
ALK PHOS: 94 U/L (ref 38–126)
ALT: 51 U/L (ref 14–54)
ANION GAP: 8 (ref 5–15)
AST: 40 U/L (ref 15–41)
BUN: 9 mg/dL (ref 6–20)
CALCIUM: 9.4 mg/dL (ref 8.9–10.3)
CO2: 26 mmol/L (ref 22–32)
Chloride: 104 mmol/L (ref 101–111)
Creatinine, Ser: 0.61 mg/dL (ref 0.44–1.00)
GFR calc Af Amer: >60 mL/min (ref >60)
GFR calc non Af Amer: >60 mL/min (ref >60)
GLUCOSE: 108 mg/dL — AB (ref 65–99)
Potassium: 4.2 mmol/L (ref 3.5–5.1)
SODIUM: 138 mmol/L (ref 135–145)
Total Bilirubin: 1.1 mg/dL (ref 0.3–1.2)
Total Protein: 7.5 g/dL (ref 6.5–8.1)

## 2015-08-20 LAB — CBC WITH DIFFERENTIAL/PLATELET
BASOS ABS: 0 10*3/uL (ref 0.0–0.1)
BASOS PCT: 0 %
EOS ABS: 0 10*3/uL (ref 0.0–0.7)
Eosinophils Relative: 0 %
HEMATOCRIT: 44.4 % (ref 36.0–46.0)
HEMOGLOBIN: 14.4 g/dL (ref 12.0–15.0)
Lymphocytes Relative: 27 %
Lymphs Abs: 2 10*3/uL (ref 0.7–4.0)
MCH: 26.3 pg (ref 26.0–34.0)
MCHC: 32.4 g/dL (ref 30.0–36.0)
MCV: 81 fL (ref 78.0–100.0)
Monocytes Absolute: 0.5 10*3/uL (ref 0.1–1.0)
Monocytes Relative: 6 %
NEUTROS ABS: 5 10*3/uL (ref 1.7–7.7)
NEUTROS PCT: 67 %
Platelets: 213 10*3/uL (ref 150–400)
RBC: 5.48 MIL/uL — AB (ref 3.87–5.11)
RDW: 14.2 % (ref 11.5–15.5)
WBC: 7.6 10*3/uL (ref 4.0–10.5)

## 2015-08-20 NOTE — Progress Notes (Signed)
Notified Valerie Aguilar in [employer] of need for bari bed day of surgery

## 2015-08-20 NOTE — Progress Notes (Signed)
ekg 4/17 on chart

## 2015-08-21 ENCOUNTER — Other Ambulatory Visit (HOSPITAL_COMMUNITY): Payer: Self-pay

## 2015-08-22 DIAGNOSIS — Z6841 Body Mass Index (BMI) 40.0 and over, adult: Secondary | ICD-10-CM | POA: Diagnosis not present

## 2015-08-22 MED FILL — PANTOPRAZOLE SOD DR 40 MG T: 40 | 30 days supply | Qty: 30 | Fill #0

## 2015-08-22 MED FILL — ONDANSETRON HCL 4 MG TABLET: 4 | 4 days supply | Qty: 15 | Fill #0

## 2015-08-22 MED FILL — oxyCODONE HCL 5 MG/5ML SOLN: 5 | 6 days supply | Qty: 200 | Fill #0

## 2015-08-25 DIAGNOSIS — Z30431 Encounter for routine checking of intrauterine contraceptive device: Secondary | ICD-10-CM | POA: Diagnosis not present

## 2015-08-26 ENCOUNTER — Inpatient Hospital Stay (HOSPITAL_COMMUNITY): Payer: 59 | Admitting: Anesthesiology

## 2015-08-26 ENCOUNTER — Encounter (HOSPITAL_COMMUNITY): Payer: Self-pay | Admitting: *Deleted

## 2015-08-26 ENCOUNTER — Encounter (HOSPITAL_COMMUNITY)
Admission: RE | Disposition: A | Discharge status: Home / Self Care | Payer: Self-pay | Source: Ambulatory Visit | Attending: General Surgery

## 2015-08-26 ENCOUNTER — Inpatient Hospital Stay (HOSPITAL_COMMUNITY)
Admission: RE | Admit: 2015-08-26 | Discharge: 2015-08-27 | DRG: 621 | Disposition: A | Discharge status: Home / Self Care | Payer: 59 | Source: Ambulatory Visit | Attending: General Surgery | Admitting: General Surgery

## 2015-08-26 DIAGNOSIS — R7303 Prediabetes: Secondary | ICD-10-CM | POA: Diagnosis present

## 2015-08-26 DIAGNOSIS — K2950 Unspecified chronic gastritis without bleeding: Secondary | ICD-10-CM | POA: Diagnosis not present

## 2015-08-26 DIAGNOSIS — E7800 Pure hypercholesterolemia, unspecified: Secondary | ICD-10-CM | POA: Diagnosis present

## 2015-08-26 DIAGNOSIS — Z6841 Body Mass Index (BMI) 40.0 and over, adult: Secondary | ICD-10-CM

## 2015-08-26 DIAGNOSIS — C541 Malignant neoplasm of endometrium: Secondary | ICD-10-CM | POA: Diagnosis present

## 2015-08-26 DIAGNOSIS — N393 Stress incontinence (female) (male): Secondary | ICD-10-CM | POA: Diagnosis present

## 2015-08-26 DIAGNOSIS — I10 Essential (primary) hypertension: Secondary | ICD-10-CM | POA: Diagnosis present

## 2015-08-26 DIAGNOSIS — G8929 Other chronic pain: Secondary | ICD-10-CM | POA: Diagnosis present

## 2015-08-26 DIAGNOSIS — Z79899 Other long term (current) drug therapy: Secondary | ICD-10-CM

## 2015-08-26 DIAGNOSIS — E785 Hyperlipidemia, unspecified: Secondary | ICD-10-CM | POA: Diagnosis not present

## 2015-08-26 DIAGNOSIS — Z8542 Personal history of malignant neoplasm of other parts of uterus: Secondary | ICD-10-CM | POA: Diagnosis not present

## 2015-08-26 HISTORY — PX: LAPAROSCOPIC GASTRIC SLEEVE RESECTION: SHX5895

## 2015-08-26 LAB — PREGNANCY, URINE: PREG TEST UR: NEGATIVE

## 2015-08-26 LAB — HEMOGLOBIN AND HEMATOCRIT, BLOOD
HCT: 45.4 % (ref 36.0–46.0)
HEMOGLOBIN: 15.1 g/dL — AB (ref 12.0–15.0)

## 2015-08-26 SURGERY — GASTRECTOMY, SLEEVE, LAPAROSCOPIC
Anesthesia: General

## 2015-08-26 MED ORDER — ACETAMINOPHEN 10 MG/ML IV SOLN
INTRAVENOUS | Status: DC | PRN
  Administered 2015-08-26: 1000 mg via INTRAVENOUS

## 2015-08-26 MED ORDER — PROPOFOL 10 MG/ML IV BOLUS
INTRAVENOUS | Status: AC
  Filled 2015-08-26: qty 20

## 2015-08-26 MED ORDER — DEXAMETHASONE SODIUM PHOSPHATE 10 MG/ML IJ SOLN
INTRAMUSCULAR | Status: AC
  Filled 2015-08-26: qty 1

## 2015-08-26 MED ORDER — FENTANYL CITRATE (PF) 100 MCG/2ML IJ SOLN: 25.0000 ug | INTRAMUSCULAR | Status: DC | PRN

## 2015-08-26 MED ORDER — LIDOCAINE HCL (CARDIAC) 20 MG/ML IV SOLN
INTRAVENOUS | Status: DC | PRN
  Administered 2015-08-26: 100 mg via INTRATRACHEAL

## 2015-08-26 MED ORDER — DEXAMETHASONE SODIUM PHOSPHATE 10 MG/ML IJ SOLN
INTRAMUSCULAR | Status: DC | PRN
  Administered 2015-08-26: 10 mg via INTRAVENOUS

## 2015-08-26 MED ORDER — LABETALOL HCL 5 MG/ML IV SOLN
INTRAVENOUS | Status: DC | PRN
  Administered 2015-08-26: 5 mg via INTRAVENOUS
  Administered 2015-08-26: 10 mg via INTRAVENOUS

## 2015-08-26 MED ORDER — LIDOCAINE HCL (CARDIAC) 20 MG/ML IV SOLN
INTRAVENOUS | Status: AC
  Filled 2015-08-26: qty 5

## 2015-08-26 MED ORDER — SODIUM CHLORIDE 0.9 % IJ SOLN
INTRAMUSCULAR | Status: DC | PRN
  Administered 2015-08-26: 50 mL

## 2015-08-26 MED ORDER — DEXTROSE 5 % IV SOLN
2.0000 g | INTRAVENOUS | Status: AC
  Administered 2015-08-26: 2 g via INTRAVENOUS

## 2015-08-26 MED ORDER — SUGAMMADEX SODIUM 500 MG/5ML IV SOLN
INTRAVENOUS | Status: DC | PRN
  Administered 2015-08-26: 500 mg via INTRAVENOUS

## 2015-08-26 MED ORDER — DEXTROSE 5 % IV SOLN
INTRAVENOUS | Status: AC
  Filled 2015-08-26: qty 2

## 2015-08-26 MED ORDER — FENTANYL CITRATE (PF) 250 MCG/5ML IJ SOLN
INTRAMUSCULAR | Status: AC
  Filled 2015-08-26: qty 5

## 2015-08-26 MED ORDER — LACTATED RINGERS IR SOLN
Status: DC | PRN
  Administered 2015-08-26: 1000 mL

## 2015-08-26 MED ORDER — MIDAZOLAM HCL 2 MG/2ML IJ SOLN
INTRAMUSCULAR | Status: AC
  Filled 2015-08-26: qty 2

## 2015-08-26 MED ORDER — HYDROMORPHONE HCL 1 MG/ML IJ SOLN: 0.2500 mg | INTRAMUSCULAR | Status: DC | PRN

## 2015-08-26 MED ORDER — CHLORHEXIDINE GLUCONATE CLOTH 2 % EX PADS: 6.0000 | MEDICATED_PAD | Freq: Once | CUTANEOUS | Status: DC

## 2015-08-26 MED ORDER — 0.9 % SODIUM CHLORIDE (POUR BTL) OPTIME
TOPICAL | Status: DC | PRN
  Administered 2015-08-26: 1000 mL

## 2015-08-26 MED ORDER — ONDANSETRON HCL 4 MG/2ML IJ SOLN
4.0000 mg | INTRAMUSCULAR | Status: DC | PRN
Start: 2015-08-26 — End: 2015-08-27

## 2015-08-26 MED ORDER — HYDROMORPHONE HCL 2 MG/ML IJ SOLN
INTRAMUSCULAR | Status: AC
  Filled 2015-08-26: qty 1

## 2015-08-26 MED ORDER — EVICEL 5 ML EX KIT
PACK | Freq: Once | CUTANEOUS | Status: AC
  Administered 2015-08-26: 5 mL
  Filled 2015-08-26: qty 1

## 2015-08-26 MED ORDER — LACTATED RINGERS IV SOLN
INTRAVENOUS | Status: DC
  Administered 2015-08-26 (×3): via INTRAVENOUS

## 2015-08-26 MED ORDER — SUCCINYLCHOLINE CHLORIDE 20 MG/ML IJ SOLN
INTRAMUSCULAR | Status: DC | PRN
  Administered 2015-08-26: 140 mg via INTRAVENOUS

## 2015-08-26 MED ORDER — ENOXAPARIN SODIUM 30 MG/0.3ML ~~LOC~~ SOLN
30.0000 mg | Freq: Two times a day (BID) | SUBCUTANEOUS | Status: DC
  Administered 2015-08-27: 30 mg via SUBCUTANEOUS
  Filled 2015-08-26 (×2): qty 0.3

## 2015-08-26 MED ORDER — PROMETHAZINE HCL 25 MG/ML IJ SOLN: 12.5000 mg | Freq: Once | INTRAMUSCULAR | Status: DC | PRN

## 2015-08-26 MED ORDER — PREMIER PROTEIN SHAKE: 2.0000 [oz_av] | ORAL | Status: DC

## 2015-08-26 MED ORDER — SUGAMMADEX SODIUM 500 MG/5ML IV SOLN
INTRAVENOUS | Status: AC
  Filled 2015-08-26: qty 5

## 2015-08-26 MED ORDER — HEPARIN SODIUM (PORCINE) 5000 UNIT/ML IJ SOLN: 5000.0000 [IU] | INTRAMUSCULAR | Status: DC

## 2015-08-26 MED ORDER — FENTANYL CITRATE (PF) 250 MCG/5ML IJ SOLN
INTRAMUSCULAR | Status: DC | PRN
  Administered 2015-08-26: 100 ug via INTRAVENOUS
  Administered 2015-08-26: 50 ug via INTRAVENOUS
  Administered 2015-08-26: 100 ug via INTRAVENOUS

## 2015-08-26 MED ORDER — MORPHINE SULFATE (PF) 2 MG/ML IV SOLN
2.0000 mg | INTRAVENOUS | Status: DC | PRN
  Administered 2015-08-26 (×3): 2 mg via INTRAVENOUS
  Filled 2015-08-26 (×4): qty 1

## 2015-08-26 MED ORDER — MIDAZOLAM HCL 5 MG/5ML IJ SOLN
INTRAMUSCULAR | Status: DC | PRN
  Administered 2015-08-26: 2 mg via INTRAVENOUS

## 2015-08-26 MED ORDER — POTASSIUM CHLORIDE IN NACL 20-0.9 MEQ/L-% IV SOLN
INTRAVENOUS | Status: DC
  Administered 2015-08-26 – 2015-08-27 (×2): via INTRAVENOUS
  Filled 2015-08-26 (×3): qty 1000

## 2015-08-26 MED ORDER — ACETAMINOPHEN 10 MG/ML IV SOLN
INTRAVENOUS | Status: AC
  Filled 2015-08-26: qty 100

## 2015-08-26 MED ORDER — CETYLPYRIDINIUM CHLORIDE 0.05 % MT LIQD
7.0000 mL | Freq: Two times a day (BID) | OROMUCOSAL | Status: DC
  Administered 2015-08-26: 7 mL via OROMUCOSAL

## 2015-08-26 MED ORDER — BUPIVACAINE LIPOSOME 1.3 % IJ SUSP
20.0000 mL | Freq: Once | INTRAMUSCULAR | Status: AC
  Administered 2015-08-26: 20 mL
  Filled 2015-08-26: qty 20

## 2015-08-26 MED ORDER — HYDROMORPHONE HCL 1 MG/ML IJ SOLN
INTRAMUSCULAR | Status: DC | PRN
  Administered 2015-08-26 (×2): 1 mg via INTRAVENOUS

## 2015-08-26 MED ORDER — ROCURONIUM BROMIDE 100 MG/10ML IV SOLN
INTRAVENOUS | Status: DC | PRN
  Administered 2015-08-26: 50 mg via INTRAVENOUS
  Administered 2015-08-26: 20 mg via INTRAVENOUS
  Administered 2015-08-26: 10 mg via INTRAVENOUS

## 2015-08-26 MED ORDER — HEPARIN SODIUM (PORCINE) 5000 UNIT/ML IJ SOLN
5000.0000 [IU] | INTRAMUSCULAR | Status: AC
  Administered 2015-08-26: 5000 [IU] via SUBCUTANEOUS
  Filled 2015-08-26: qty 1

## 2015-08-26 MED ORDER — ONDANSETRON HCL 4 MG/2ML IJ SOLN
INTRAMUSCULAR | Status: DC | PRN
  Administered 2015-08-26: 4 mg via INTRAVENOUS

## 2015-08-26 MED ORDER — ONDANSETRON HCL 4 MG/2ML IJ SOLN
INTRAMUSCULAR | Status: AC
  Filled 2015-08-26: qty 2

## 2015-08-26 MED ORDER — PROPOFOL 10 MG/ML IV BOLUS
INTRAVENOUS | Status: DC | PRN
  Administered 2015-08-26: 300 mg via INTRAVENOUS

## 2015-08-26 MED ORDER — FAMOTIDINE IN NACL 20-0.9 MG/50ML-% IV SOLN
20.0000 mg | Freq: Two times a day (BID) | INTRAVENOUS | Status: DC
  Administered 2015-08-27: 20 mg via INTRAVENOUS
  Filled 2015-08-26 (×3): qty 50

## 2015-08-26 MED ORDER — SODIUM CHLORIDE 0.9 % IJ SOLN
INTRAMUSCULAR | Status: AC
  Filled 2015-08-26: qty 50

## 2015-08-26 MED ORDER — OXYCODONE HCL 5 MG/5ML PO SOLN
5.0000 mg | ORAL | Status: DC | PRN
  Administered 2015-08-27 (×2): 5 mg via ORAL
  Filled 2015-08-26 (×2): qty 5

## 2015-08-26 MED ORDER — ACETAMINOPHEN 160 MG/5ML PO SOLN: 325.0000 mg | ORAL | Status: DC | PRN

## 2015-08-26 MED ORDER — AMLODIPINE BESYLATE 10 MG PO TABS
10.0000 mg | ORAL_TABLET | Freq: Every day | ORAL | Status: DC
  Administered 2015-08-27: 10 mg via ORAL
  Filled 2015-08-26: qty 1

## 2015-08-26 MED ORDER — ROCURONIUM BROMIDE 100 MG/10ML IV SOLN
INTRAVENOUS | Status: AC
  Filled 2015-08-26: qty 1

## 2015-08-26 MED ORDER — DEXTROSE 5 % IV SOLN: 2.0000 g | INTRAVENOUS | Status: DC

## 2015-08-26 MED ORDER — ACETAMINOPHEN 160 MG/5ML PO SOLN: 650.0000 mg | ORAL | Status: DC | PRN

## 2015-08-26 MED ORDER — LABETALOL HCL 5 MG/ML IV SOLN
INTRAVENOUS | Status: AC
  Filled 2015-08-26: qty 4

## 2015-08-26 SURGICAL SUPPLY — 59 items
APPLICATOR COTTON TIP 6IN STRL (MISCELLANEOUS) IMPLANT
APPLIER CLIP ROT 10 11.4 M/L (STAPLE)
APPLIER CLIP ROT 13.4 12 LRG (CLIP) ×3
BLADE SURG SZ11 CARB STEEL (BLADE) ×3 IMPLANT
CABLE HIGH FREQUENCY MONO STRZ (ELECTRODE) ×3 IMPLANT
CHLORAPREP W/TINT 26ML (MISCELLANEOUS) ×6 IMPLANT
CLIP APPLIE ROT 10 11.4 M/L (STAPLE) IMPLANT
CLIP APPLIE ROT 13.4 12 LRG (CLIP) ×1 IMPLANT
COVER SURGICAL LIGHT HANDLE (MISCELLANEOUS) IMPLANT
DEVICE PMI PUNCTURE CLOSURE (MISCELLANEOUS) ×3 IMPLANT
DEVICE SUT QUICK LOAD TK 5 (STAPLE) IMPLANT
DEVICE SUT TI-KNOT TK 5X26 (MISCELLANEOUS) IMPLANT
DEVICE SUTURE ENDOST 10MM (ENDOMECHANICALS) IMPLANT
DEVICE TI KNOT TK5 (MISCELLANEOUS)
DRAPE UTILITY XL STRL (DRAPES) ×6 IMPLANT
ELECT REM PT RETURN 9FT ADLT (ELECTROSURGICAL) ×3
ELECTRODE REM PT RTRN 9FT ADLT (ELECTROSURGICAL) ×1 IMPLANT
GAUZE SPONGE 4X4 12PLY STRL (GAUZE/BANDAGES/DRESSINGS) IMPLANT
GLOVE BIOGEL PI IND STRL 7.5 (GLOVE) ×2 IMPLANT
GLOVE BIOGEL PI INDICATOR 7.5 (GLOVE) ×4
GLOVE ECLIPSE 7.5 STRL STRAW (GLOVE) ×3 IMPLANT
GOWN STRL REUS W/TWL XL LVL3 (GOWN DISPOSABLE) ×12 IMPLANT
HOVERMATT SINGLE USE (MISCELLANEOUS) ×3 IMPLANT
IRRIG SUCT STRYKERFLOW 2 WTIP (MISCELLANEOUS) ×3
IRRIGATION SUCT STRKRFLW 2 WTP (MISCELLANEOUS) ×1 IMPLANT
KIT BASIN OR (CUSTOM PROCEDURE TRAY) ×3 IMPLANT
LIQUID BAND (GAUZE/BANDAGES/DRESSINGS) ×3 IMPLANT
MARKER SKIN DUAL TIP RULER LAB (MISCELLANEOUS) ×3 IMPLANT
NEEDLE SPNL 22GX3.5 QUINCKE BK (NEEDLE) ×3 IMPLANT
PACK UNIVERSAL I (CUSTOM PROCEDURE TRAY) ×3 IMPLANT
QUICK LOAD TK 5 (STAPLE)
RELOAD STAPLER BLUE 60MM (STAPLE) ×1 IMPLANT
RELOAD STAPLER GOLD 60MM (STAPLE) ×2 IMPLANT
RELOAD STAPLER GREEN 60MM (STAPLE) ×2 IMPLANT
SCISSORS LAP 5X45 EPIX DISP (ENDOMECHANICALS) ×3 IMPLANT
SHEARS HARMONIC ACE PLUS 45CM (MISCELLANEOUS) ×3 IMPLANT
SLEEVE ADV FIXATION 5X100MM (TROCAR) ×3 IMPLANT
SLEEVE GASTRECTOMY 36FR VISIGI (MISCELLANEOUS) ×3 IMPLANT
SOLUTION ANTI FOG 6CC (MISCELLANEOUS) ×3 IMPLANT
SPONGE LAP 18X18 X RAY DECT (DISPOSABLE) ×3 IMPLANT
STAPLER ECHELON LONG 60 440 (INSTRUMENTS) ×3 IMPLANT
STAPLER RELOAD BLUE 60MM (STAPLE) ×3
STAPLER RELOAD GOLD 60MM (STAPLE) ×6
STAPLER RELOAD GREEN 60MM (STAPLE) ×6
SUT DEVICE BRAIDED 0X39 (SUTURE) IMPLANT
SUT MNCRL AB 4-0 PS2 18 (SUTURE) ×3 IMPLANT
SUT VICRYL 0 TIES 12 18 (SUTURE) IMPLANT
SYR 10ML ECCENTRIC (SYRINGE) ×3 IMPLANT
SYR 20CC LL (SYRINGE) ×6 IMPLANT
TIP RIGID 35CM EVICEL (HEMOSTASIS) ×3 IMPLANT
TOWEL OR 17X26 10 PK STRL BLUE (TOWEL DISPOSABLE) ×3 IMPLANT
TOWEL OR NON WOVEN STRL DISP B (DISPOSABLE) ×3 IMPLANT
TROCAR ADV FIXATION 5X100MM (TROCAR) ×3 IMPLANT
TROCAR BLADELESS 15MM (ENDOMECHANICALS) ×3 IMPLANT
TROCAR BLADELESS OPT 5 100 (ENDOMECHANICALS) ×3 IMPLANT
TUBING CONNECTING 10 (TUBING) ×2 IMPLANT
TUBING CONNECTING 10' (TUBING) ×1
TUBING ENDO SMARTCAP PENTAX (MISCELLANEOUS) ×3 IMPLANT
TUBING INSUF HEATED (TUBING) ×3 IMPLANT

## 2015-08-26 NOTE — Anesthesia Procedure Notes (Signed)
Procedure Name: Intubation Date/Time: 08/26/2015 9:43 AM Performed by: [place] Kileen Lange C Pre-anesthesia Checklist: Patient identified, Emergency Drugs available, Suction available and Patient being monitored Patient Re-evaluated:Patient Re-evaluated prior to inductionOxygen Delivery Method: Circle system utilized Preoxygenation: Pre-oxygenation with 100% oxygen Intubation Type: IV induction Ventilation: Mask ventilation without difficulty and Oral airway inserted - appropriate to patient size Laryngoscope Size: Mac and 3 Grade View: Grade I Tube type: Oral Tube size: 7.0 mm Number of attempts: 1 Airway Equipment and Method: Stylet and Oral airway Placement Confirmation: ETT inserted through vocal cords under direct vision,  positive ETCO2 and breath sounds checked- equal and bilateral Secured at: 22 cm Tube secured with: Tape Dental Injury: Teeth and Oropharynx as per pre-operative assessment

## 2015-08-26 NOTE — Op Note (Signed)
Preoperative diagnosis: laparoscopic sleeve gastrectomy  Postoperative diagnosis: Same   Procedure: Upper endoscopy   Surgeon: Gurney Maxin, M.D.  Anesthesia: Gen.   Indications for procedure: This patient was undergoing a laparoscopic sleeve gastrectomy.   Description of procedure: The endoscopy was placed in the mouth and into the oropharynx and under endoscopic vision it was advanced to the esophagogastric junction. The pouch was insufflated and no bleeding or bubbles were seen. The GEJ was identified at 46cm from the teeth. Small amount blood was pooled in the body of the stomach and was evacuated. No further bleeding or leaks were detected. The scope was withdrawn without difficulty.   Gurney Maxin, M.D. General, Bariatric, & Minimally Invasive Surgery [facility]

## 2015-08-26 NOTE — Progress Notes (Signed)
Patient alert and oriented,  op day 1.  Provided support and encouragement.  Encouraged pulmonary toilet, ambulation and small sips of liquids.  All questions answered.  Will continue to monitor.

## 2015-08-26 NOTE — H&P (Signed)
  History of Present Illness Valerie Aguilar T. Baraka Klatt MD; 08/22/2015 4:27 PM) Patient words: Pre Op Sleeve.  The patient is a 35 year old female who presents with obesity. She returns for her preop visit prior to planned laparoscopic sleeve gastrectomy. She was referred by Iran Planas PA for consideration for surgical treatment for morbid obesity. The patient gives a history of progressive obesity since early adolescence despite multiple attempts at medical management. She has been able to lose some weight on a number of occasions and experiences progressive weight regain. 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 She has shortness of breath with significant exertion, fatigue and some chronic joint pain. Significant co-morbid illnesses have developed including hypertension, elevated cholesterol and prediabetes. She is concerned about her long-term health going forward at her current weight. After our initial evaluation we elected to proceed with laparoscopic sleeve gastrectomy. She has successfully completed her preoperative workup. No concerns on psychologic or nutritional evaluation. Imaging including upper GI series was unremarkable. Lab work all unremarkable except for slightly low folate level.  Since her initial visit she has been diagnosed with a very early stage endometrial cancer which is being treated with a progesterone IUD only at this time.   Allergies Sander Nephew, CMA; 08/22/2015 4:19 PM) Lisinopril *ANTIHYPERTENSIVES*  Medication History Sander Nephew, CMA; 08/22/2015 4:20 PM) AmLODIPine Besylate (10MG  Tablet, Oral daily) Active. Atorvastatin Calcium (40MG  Tablet, Oral daily) Active. Vitamin D (Ergocalciferol) (50000UNIT Capsule, Oral once a week) Active. Medications Reconciled  Vitals Georgia Lopes Albanese CMA;  08/22/2015 4:19 PM) 08/22/2015 4:18 PM Weight: 395.4 lb Height: 67in Body Surface Area: 2.7 m Body Mass Index: 61.93 kg/m  Temp.: 97.27F(Temporal)  Pulse: 103 (Regular)  P.OX: 98% (Room air) BP: 142/70 (Sitting, Left Arm, Standard)       Physical Exam Valerie Aguilar T. Gaytha Raybourn MD; 08/22/2015 4:29 PM) General Note: General: Alert, young Caucasian female, markedly morbidly obese, in no distress Skin: Warm and dry without rash or infection. HEENT: No palpable masses or thyromegaly. Sclera nonicteric. Pupils equal round and reactive. Lymph nodes: No cervical, supraclavicular, or inguinal nodes palpable. Lungs: Breath sounds clear and equal. No wheezing or increased work of breathing. Cardiovascular: Regular rate and rhythm without murmer. No JVD or edema. Peripheral pulses intact. Abdomen: Nondistended. Soft and nontender. No masses palpable. No organomegaly. No palpable hernias. Extremities: No edema or joint swelling or deformity. No chronic venous stasis changes. Neurologic: Alert and fully oriented. Gait normal. No focal weakness. Psychiatric: Normal mood and affect. Thought content appropriate with normal judgement and insight     Assessment & Plan Valerie Aguilar T. Marino Rogerson MD; 08/22/2015 4:29 PM) MORBID OBESITY WITH BMI OF 60.0-69.9, ADULT (E66.01) Impression: Patient with progressive morbid obesity unresponsive to multiple efforts at medical management who presents with a BMI of 63.5 and comorbidities of hypertension, elevated cholesterol, prediabetes and stress urinary incontinence.. I believe there would be very significant medical benefit from surgical weight loss. After our discussion of surgical options currently available the patient has decided to proceed with laparoscopic sleeve gastrectomy. We again reviewed the procedure and expected recovery and risks and consent form was reviewed. All her questions were answered. She is given prescription for Protonix as well as Zofran  and oxycodone to feel preoperatively. Ready to proceed with planned laparoscopic sleeve gastrectomy.

## 2015-08-26 NOTE — Op Note (Signed)
Preoperative Diagnosis: Morbid Obesity, Hyperlipidemia, HTN, Pre Diabetes, H O Endometrial Cancer  Postoprative Diagnosis: Morbid Obesity, Hyperlipidemia, HTN, Pre Diabetes, H O Endometrial Cancer  Procedure: Procedure(s): LAPAROSCOPIC GASTRIC SLEEVE RESECTION, UPPER ENDOSCOPY   Surgeon: Excell Seltzer T   Assistants: Gurney Maxin  Anesthesia:  General endotracheal anesthesia  Indications: Patient is a 35 year old female with progressive morbid obesity unresponsive to medical management who presents with a BMI 63.5 and comorbidities of hypertension, dyslipidemia, prediabetes and stress urinary incontinence. After extensive preoperative discussion and workup detailed elsewhere and we are proceeding with laparoscopic sleeve gastrectomy for surgical treatment of her morbid obesity.    Procedure Detail:  Patient was brought to the operating room, placed in the supine position on the operating table, and general endotracheal anesthesia induced. She received preoperative IV antibiotics and subcutaneous heparin. PAS were in place. The abdomen was widely sterilely prepped and draped. Patient timeout was performed and correct procedure verified. Access was obtained with a 5 mm Optiview trocar in the left upper quadrant and pneumoperitoneum established. No evidence of trocar injury. Under direct vision a 5 mm trocar was placed laterally in the right upper quadrant, a 15 mm trocar in the right upper abdomen at the base of the falciform ligament, 5 mm trocar to the left of the umbilicus for the camera port. Under direct vision through a 5 mm subxiphoid site the [facility] retractor was placed in the left lobe of the liver elevated with good exposure of the hiatus and stomach. Also under direct vision a bilateral TA P block was performed with Exparel diluted to 70 mL. Finally a 5 mm trocar was placed laterally in the left upper quadrant. Beginning at the mid greater curve the vasculature was dissected with  Harmonic scalpel and the lesser sac entered. Dissection progressed proximally and short gastrics individually divided with the Harmonic scalpel. The fundus was mobilized away from the spleen. There was a fairly extensive posterior attachment to the spleen that was completelymobilized. Dissection continued up along the left crus which was dissected and exposed and the upper stomach completely freed to its lesser curvevasculature. The dissection then progressed distally along the greater curve and a similar fashion until the vasculature had been divided to within 5-6 cm the pylorus. The VisiG 63 French tube was passed orally into the stomach and advanced with the tip of the pylorus. It was positioned along the lesser curve and fixed with suction in place. The sleeve gastrectomy was begun with an additional firing of the green load 60 mm echelon stapler beginning at about 6 cm from the pylorus and angling up toward but staying well away from the incisura. A second firing of the 60 mm green load stapler was used to continue the sleeve passed the incisura leaving a little extra room adjacent to the tube at this point. Following this 3 firings of the gold load stapler were used staying a little closer to the tube as were in the upper stomach and finally a blue load stapler used to come across the fundus just lateral to the esophageal fat pad. The sleeve was insufflated under saline irrigation and there was no evidence of leak. Appeared symmetrical with no twisting or narrowing. The instrument performed upper endoscopy again showing no leak under insufflation and irrigation and no narrowing or twisting or bleeding. The staple line. A few bleeding points controlled with clips and then Evicel was applied along the entire staple line. The gastrectomy specimen was brought up through the 15 mm trocar site  which was dilated and the specimen extracted. The fascial defect at the site was closed with interrupted 0 Vicryl. The  Nathanson retractor was removed under direct vision. All CO2 was evacuated and trochars removed. Skin incisions closed with subcuticular Monocryl and Liquiban. Sponge needle and instrument counts were correct.    Findings: As above  Estimated Blood Loss:  Minimal         Drains: none  Blood Given: none          Specimens: Greater curvature of stomach        Complications:  * No complications entered in OR log *         Disposition: PACU - hemodynamically stable.         Condition: stable

## 2015-08-26 NOTE — Anesthesia Postprocedure Evaluation (Signed)
Anesthesia Post Note  Patient: Valerie Aguilar  Procedure(s) Performed: Procedure(s) (LRB): LAPAROSCOPIC GASTRIC SLEEVE RESECTION, UPPER ENDOSCOPY (N/A)  Patient location during evaluation: PACU Anesthesia Type: General Level of consciousness: awake and alert Pain management: pain level controlled Vital Signs Assessment: post-procedure vital signs reviewed and stable Respiratory status: spontaneous breathing, nonlabored ventilation and respiratory function stable Cardiovascular status: blood pressure returned to baseline and stable Postop Assessment: no signs of nausea or vomiting Anesthetic complications: no    Last Vitals:  Vitals:   08/26/15 1300 08/26/15 1315  BP: 118/74 113/70  Pulse: 88 88  Resp: 17 13  Temp:  36.5 C    Last Pain:  Vitals:   08/26/15 1315  TempSrc:   PainSc: 0-No pain                 Nilda Simmer

## 2015-08-26 NOTE — Discharge Instructions (Signed)

## 2015-08-26 NOTE — Interval H&P Note (Signed)
History and Physical Interval Note:  08/26/2015 11:51 AM  Valerie Aguilar  has presented today for surgery, with the diagnosis of Morbid Obesity, Hyperlipidemia, HTN, Pre-Diabetes, H/O Endometrial Cancer  The various methods of treatment have been discussed with the patient and family. After consideration of risks, benefits and other options for treatment, the patient has consented to  Procedure(s): LAPAROSCOPIC GASTRIC SLEEVE RESECTION, UPPER ENDOSCOPY (N/A) as a surgical intervention .  The patient's history has been reviewed, patient examined, no change in status, stable for surgery.  I have reviewed the patient's chart and labs.  Questions were answered to the patient's satisfaction.     Valerie Aguilar

## 2015-08-26 NOTE — Transfer of Care (Signed)
Immediate Anesthesia Transfer of Care Note  Patient: Valerie Aguilar  Procedure(s) Performed: Procedure(s): LAPAROSCOPIC GASTRIC SLEEVE RESECTION, UPPER ENDOSCOPY (N/A)  Patient Location: PACU  Anesthesia Type:General  Level of Consciousness: alert  Airway & Oxygen Therapy: Patient Spontanous Breathing and Patient connected to face mask oxygen  Post-op Assessment: Report given to RN and Post -op Vital signs reviewed and stable  Post vital signs: Reviewed and stable  Last Vitals:  Vitals:   08/26/15 0758 08/26/15 0818  BP: (!) 148/89   Pulse: (!) 118 89  Resp: 20   Temp: 37.4 C 36.8 C    Last Pain:  Vitals:   08/26/15 0818  TempSrc: Oral         Complications: No apparent anesthesia complications

## 2015-08-26 NOTE — Anesthesia Preprocedure Evaluation (Addendum)
Anesthesia Evaluation  Patient identified by MRN, date of birth, ID band Patient awake    Reviewed: Allergy & Precautions, NPO status , Patient's Chart, lab work & pertinent test results  History of Anesthesia Complications Negative for: history of anesthetic complications  Airway Mallampati: II  TM Distance: >3 FB Neck ROM: Full   Comment: Previous grade III view with MAC 3 Dental  (+) Teeth Intact, Dental Advisory Given   Pulmonary neg shortness of breath, neg COPD, neg recent URI,  Symptoms suggestive of OSA but no diagnosis   Pulmonary exam normal breath sounds clear to auscultation       Cardiovascular hypertension, (-) angina(-) Past MI, (-) Cardiac Stents and (-) CABG (-) pacemaker Rhythm:Regular Rate:Normal     Neuro/Psych negative neurological ROS     GI/Hepatic negative GI ROS, Neg liver ROS,   Endo/Other  Morbid obesityprediabetes  Renal/GU negative Renal ROS     Musculoskeletal  (+) Arthritis ,   Abdominal (+) + obese,   Peds  Hematology negative hematology ROS (+)   Anesthesia Other Findings HLD, h/o endometrial cancer  Reproductive/Obstetrics                           Anesthesia Physical Anesthesia Plan  ASA: III  Anesthesia Plan: General   Post-op Pain Management:    Induction: Intravenous  Airway Management Planned: Oral ETT and Video Laryngoscope Planned  Additional Equipment:   Intra-op Plan:   Post-operative Plan: Extubation in OR  Informed Consent: I have reviewed the patients History and Physical, chart, labs and discussed the procedure including the risks, benefits and alternatives for the proposed anesthesia with the patient or authorized representative who has indicated his/her understanding and acceptance.   Dental advisory given  Plan Discussed with:   Anesthesia Plan Comments:        Anesthesia Quick Evaluation

## 2015-08-27 LAB — CBC WITH DIFFERENTIAL/PLATELET
BASOS PCT: 0 %
Basophils Absolute: 0 10*3/uL (ref 0.0–0.1)
Eosinophils Absolute: 0 10*3/uL (ref 0.0–0.7)
Eosinophils Relative: 0 %
HCT: 43.4 % (ref 36.0–46.0)
Hemoglobin: 14.1 g/dL (ref 12.0–15.0)
Lymphocytes Relative: 9 %
Lymphs Abs: 1 10*3/uL (ref 0.7–4.0)
MCH: 26.5 pg (ref 26.0–34.0)
MCHC: 32.5 g/dL (ref 30.0–36.0)
MCV: 81.6 fL (ref 78.0–100.0)
MONOS PCT: 4 %
Monocytes Absolute: 0.5 10*3/uL (ref 0.1–1.0)
NEUTROS ABS: 9.1 10*3/uL — AB (ref 1.7–7.7)
NEUTROS PCT: 87 %
PLATELETS: 233 10*3/uL (ref 150–400)
RBC: 5.32 MIL/uL — ABNORMAL HIGH (ref 3.87–5.11)
RDW: 14.7 % (ref 11.5–15.5)
WBC: 10.6 10*3/uL — ABNORMAL HIGH (ref 4.0–10.5)

## 2015-08-27 NOTE — Plan of Care (Signed)
Problem: Food- and Nutrition-Related Knowledge Deficit (NB-1.1) Goal: Nutrition education Formal process to instruct or train a patient/client in a skill or to impart knowledge to help patients/clients voluntarily manage or modify food choices and eating behavior to maintain or improve health.  Outcome: Completed/Met Date Met: 08/27/15 Nutrition Education Note  Received consult for diet education per DROP protocol.   Discussed 2 week post op diet with pt. Emphasized that liquids must be non carbonated, non caffeinated, and sugar free. Fluid goals discussed. Reviewed progression of diet to include soft proteins at 7-10 days post-op. Pt to follow up with outpatient bariatric RD for further diet progression after 2 weeks. Multivitamins and minerals also reviewed. Teach back method used, pt expressed understanding, expect good compliance.   Diet: First 2 Weeks  You will see the dietitian about two (2) weeks after your surgery. The dietitian will increase the types of foods you can eat if you are handling liquids well:  If you have severe vomiting or nausea and cannot handle clear liquids lasting longer than 1 day, call your surgeon  Protein Shake  Drink at least 2 ounces of shake 5-6 times per day  Each serving of protein shakes (usually 8 - 12 ounces) should have a minimum of:  15 grams of protein  And no more than 5 grams of carbohydrate  Goal for protein each day:  Men = 80 grams per day  Women = 60 grams per day  Protein powder may be added to fluids such as non-fat milk or Lactaid milk or Soy milk (limit to 35 grams added protein powder per serving)   Hydration  Slowly increase the amount of water and other clear liquids as tolerated (See Acceptable Fluids)  Slowly increase the amount of protein shake as tolerated  Sip fluids slowly and throughout the day  May use sugar substitutes in small amounts (no more than 6 - 8 packets per day; i.e. Splenda)   Fluid Goal  The first goal is to  drink at least 8 ounces of protein shake/drink per day (or as directed by the nutritionist); some examples of protein shakes are Syntrax Nectar, Adkins Advantage, EAS Edge HP, and Unjury. See handout from pre-op Bariatric Education Class:  Slowly increase the amount of protein shake you drink as tolerated  You may find it easier to slowly sip shakes throughout the day  It is important to get your proteins in first  Your fluid goal is to drink 64 - 100 ounces of fluid daily  It may take a few weeks to build up to this  32 oz (or more) should be clear liquids  And  32 oz (or more) should be full liquids (see below for examples)  Liquids should not contain sugar, caffeine, or carbonation   Clear Liquids:  Water or Sugar-free flavored water (i.e. Fruit H2O, Propel)  Decaffeinated coffee or tea (sugar-free)  Crystal Lite, Wyler's Lite, Minute Maid Lite  Sugar-free Jell-O  Bouillon or broth  Sugar-free Popsicle: *Less than 20 calories each; Limit 1 per day   Full Liquids:  Protein Shakes/Drinks + 2 choices per day of other full liquids  Full liquids must be:  No More Than 12 grams of Carbs per serving  No More Than 3 grams of Fat per serving  Strained low-fat cream soup  Non-Fat milk  Fat-free Lactaid Milk  Sugar-free yogurt (Dannon Lite & Fit, Greek yogurt)     Valerie Nieves, MS, RD, LDN Pager: [phone] After Hours Pager: [phone]    

## 2015-08-27 NOTE — Discharge Summary (Signed)
   Patient ID: Valerie Aguilar [id number] 34 y.o. August 01, 1980  08/26/2015  Discharge date and time: 08/27/2015   Admitting Physician: Excell Seltzer T  Discharge Physician: Excell Seltzer T  Admission Diagnoses: Morbid Obesity, Hyperlipidemia, HTN, Pre Diabetes, H O Endometrial Cancer  Discharge Diagnoses: Same  Operations: Procedure(s): LAPAROSCOPIC GASTRIC SLEEVE RESECTION, UPPER ENDOSCOPY  Admission Condition: good  Discharged Condition: good  Indication for Admission: Patient is a 35 year old female who presents with progressive morbid obesity unresponsive to medical management with a BMI of 62 and comorbidities of hypertension, dyslipidemia, prediabetes and urinary incontinence. After extensive preoperative workup and discussion detailed elsewhere she is electively admitted forsleeve gastrectomy for treatment of her morbid obesity.  Hospital Course: On the morning of admission she underwent an uneventful sleeve gastrectomy. Her postoperative course was very smooth. She had mild pain and nausea that was well controlled the first evening. She was begun on ice and water on the day of surgery which she tolerated. On the first postoperative day she has minimally elevated white blood count and stable hemoglobin. Vital signs are within normal limits. She is comfortable and tolerating protein shakes. Abdomen is soft and nontender and wounds are healing well. She is felt ready for discharge.  Disposition: Home  Patient Instructions:    Medication List    TAKE these medications   amLODipine 10 MG tablet Commonly known as:  NORVASC Take 1 tablet (10 mg total) by mouth daily. Notes to patient:  Monitor Blood Pressure Daily and keep a log for primary care physician.  You may need to make changes to your medications with rapid weight loss.     atorvastatin 40 MG tablet Commonly known as:  LIPITOR Take 1 tablet (40 mg total) by mouth daily.   ergocalciferol 50000 units  capsule Commonly known as:  VITAMIN D2 Take 50,000 Units by mouth once a week. saturdays       Activity: activity as tolerated Diet: bariatric protein shakes Wound Care: none needed  Follow-up:  With Dr. Excell Seltzer in 3 weeks.  Signed: Edward Jolly MD, FACS  08/27/2015, 12:42 PM

## 2015-08-28 ENCOUNTER — Telehealth (HOSPITAL_COMMUNITY): Payer: Self-pay

## 2015-08-28 NOTE — Telephone Encounter (Signed)
Made discharge phone call to patient per DROP protocol. Asking the following questions.    1. Do you have someone to care for you now that you are home?  yes 2. Are you having pain now that is not relieved by your pain medication?  no 3. Are you able to drink the recommended daily amount of fluids (48 ounces minimum/day) and protein (60-80 grams/day) as prescribed by the dietitian or nutritional counselor?   Working on it, gotten in about 10oz protein and 4-5 oz water today 4. Are you taking the vitamins and minerals as prescribed?  yes 5. Do you have the "on call" number to contact your surgeon if you have a problem or question?  yes 6. Are your incisions free of redness, swelling or drainage? (If steri strips, address that these can fall off, shower as tolerated) yes 7. Have your bowels moved since your surgery?  If not, are you passing gas?  No, yes 8. Are you up and walking 3-4 times per day?  yes

## 2015-09-09 ENCOUNTER — Encounter: Payer: 59 | Attending: General Surgery

## 2015-09-09 DIAGNOSIS — Z01818 Encounter for other preprocedural examination: Secondary | ICD-10-CM | POA: Diagnosis not present

## 2015-09-09 DIAGNOSIS — Z6841 Body Mass Index (BMI) 40.0 and over, adult: Secondary | ICD-10-CM

## 2015-09-09 NOTE — Progress Notes (Signed)
Bariatric Class:  Appt start time: 1530 end time:  1630.  2 Week Post-Operative Nutrition Class  Patient was seen on 09/09/2015 for Post-Operative Nutrition education at [facility].   Surgery date: 08/26/2015 Surgery type: sleeve gastrectomy Start weight at [facility]: 409 lbs on 05/30/2015 Weight today: 367.8 lbs  Weight change: 40.3 lbs  TANITA  BODY COMP RESULTS  07/28/15 09/09/15   BMI (kg/m^2) N/A 57.6   Fat Mass (lbs)  212.6   Fat Free Mass (lbs)  115.2   Total Body Water (lbs)  N/A   The following the learning objectives were met by the patient during this course:  Identifies Phase 3A (Soft, High Proteins) Dietary Goals and will begin from 2 weeks post-operatively to 2 months post-operatively  Identifies appropriate sources of fluids and proteins   States protein recommendations and appropriate sources post-operatively  Identifies the need for appropriate texture modifications, mastication, and bite sizes when consuming solids  Identifies appropriate multivitamin and calcium sources post-operatively  Describes the need for physical activity post-operatively and will follow MD recommendations  States when to call healthcare provider regarding medication questions or post-operative complications  Handouts given during class include:  Phase 3A: Soft, High Protein Diet Handout  Follow-Up Plan: Patient will follow-up at [facility] in 6 weeks for 2 month post-op nutrition visit for diet advancement per MD.

## 2015-10-13 ENCOUNTER — Encounter: Payer: Self-pay | Admitting: Physician Assistant

## 2015-10-13 ENCOUNTER — Ambulatory Visit (INDEPENDENT_AMBULATORY_CARE_PROVIDER_SITE_OTHER): Payer: 59 | Admitting: Physician Assistant

## 2015-10-13 VITALS — BP 136/74 | HR 75 | Ht 67.0 in | Wt 359.0 lb

## 2015-10-13 DIAGNOSIS — C541 Malignant neoplasm of endometrium: Secondary | ICD-10-CM

## 2015-10-13 DIAGNOSIS — E559 Vitamin D deficiency, unspecified: Secondary | ICD-10-CM

## 2015-10-13 NOTE — Progress Notes (Addendum)
Subjective:     Patient ID: Valerie Aguilar, female   DOB: 1980/08/14, 35 y.o.   MRN: [id number]  HPI Patient is a 35 y.o. Caucasian female presenting today for a follow-up after her recent gastric sleeve surgery. The patients notes that she has gone from 413lbs to 359lbs at today's visit. The patient reports that she is doing well following her recent surgery and has no acute complaints. The patient denies any increased anxiety or depression due to her recent endometrial cancer diagnosis and surgery. The patient states that her blood pressure is usually well controlled at home with readings between 110-120/60-70 mmHg. The patient notes that she has just returned to work and has stopped taking her atorvastatin since last June. The patient reports that she taking a multvitamin twice daily, calium citrate twice daily, and vitamin B12 twice daily. The patient denies abdominal pain, fever, shortness of breath, chest pain, or changes in bowel habits.  Review of Systems  Constitutional: Negative for activity change, appetite change, chills, diaphoresis, fatigue, fever and unexpected weight change.  HENT: Negative for congestion, ear discharge, ear pain, postnasal drip, rhinorrhea, sinus pressure and sore throat.   Eyes: Negative.   Respiratory: Negative for cough, chest tightness, shortness of breath and wheezing.   Cardiovascular: Negative for chest pain, palpitations and leg swelling.  Gastrointestinal: Negative for abdominal distention, abdominal pain, blood in stool, constipation, diarrhea, nausea and vomiting.  Endocrine: Negative.   Genitourinary: Negative.   Musculoskeletal: Negative.   Skin: Negative.   Neurological: Negative for dizziness, speech difficulty, weakness, light-headedness, numbness and headaches.  Psychiatric/Behavioral: Negative.       Objective:   Physical Exam  Constitutional: She is oriented to person, place, and time. She appears well-developed and well-nourished. No  distress.  HENT:  Head: Normocephalic and atraumatic.  Right Ear: External ear normal.  Left Ear: External ear normal.  Nose: Nose normal.  Mouth/Throat: Oropharynx is clear and moist. No oropharyngeal exudate.  Eyes: Conjunctivae and EOM are normal. Pupils are equal, round, and reactive to light. Right eye exhibits no discharge. Left eye exhibits no discharge. No scleral icterus.  Neck: Normal range of motion. Neck supple. No JVD present. No tracheal deviation present. No thyromegaly present.  Cardiovascular: Normal rate, regular rhythm, normal heart sounds and intact distal pulses.  Exam reveals no gallop and no friction rub.   No murmur heard. Pulmonary/Chest: Effort normal. No stridor. No respiratory distress. She has no wheezes. She has no rales. She exhibits no tenderness.  Abdominal: Soft. Bowel sounds are normal. She exhibits no distension and no mass. There is no tenderness. There is no rebound and no guarding.  Lymphadenopathy:    She has no cervical adenopathy.  Neurological: She is alert and oriented to person, place, and time. No cranial nerve deficit. Coordination normal.  Skin: Skin is warm and dry. No rash noted. She is not diaphoretic. No erythema. No pallor.  Psychiatric: She has a normal mood and affect. Her behavior is normal. Judgment and thought content normal.      Assessment:     Diagnoses and all orders for this visit:  Endometrial adenocarcinoma ([place])  Vitamin D deficiency -     VITAMIN D 25 Hydroxy (Vit-D Deficiency, Fractures)      Plan:     1. Endometrial adenocarcinoma - Patient to continue current medical management with oncologist at this time. Patient reports that she is currently undergoing progesterone therapy as to preserve fertility. Will continue to monitor. If she continues to  have abnormal cancer cells via biopsy at 1 year will have to have hysterectomy.   2. Vitamin D deficiency- Patient to get serum vitamin D levels obtained today. Patient  reports that she is not currently taking vitamin D and may need to status-post her recent gastric surgery. Will call patient with labwork results and determine need for further medication intervention at that time.    Summary - Patient to follow-up in 6 month for routine medical management.

## 2015-10-14 LAB — VITAMIN D 25 HYDROXY (VIT D DEFICIENCY, FRACTURES): VIT D 25 HYDROXY: 27 ng/mL — AB (ref 30–100)

## 2015-10-15 ENCOUNTER — Other Ambulatory Visit: Payer: Self-pay | Admitting: *Deleted

## 2015-10-15 MED ORDER — ERGOCALCIFEROL 1.25 MG (50000 UT) PO CAPS: 50000.0000 [IU] | ORAL_CAPSULE | ORAL | 1 refills | Status: DC

## 2015-10-16 MED FILL — VIT D2 1.25 MG (50,000 UNIT: 1.25 MG | 84 days supply | Qty: 12 | Fill #0

## 2015-10-17 ENCOUNTER — Ambulatory Visit: Payer: Self-pay | Admitting: Physician Assistant

## 2015-10-21 ENCOUNTER — Encounter: Payer: 59 | Attending: General Surgery | Admitting: Dietician

## 2015-10-21 DIAGNOSIS — N858 Other specified noninflammatory disorders of uterus: Secondary | ICD-10-CM | POA: Diagnosis not present

## 2015-10-21 DIAGNOSIS — Z6841 Body Mass Index (BMI) 40.0 and over, adult: Secondary | ICD-10-CM

## 2015-10-21 DIAGNOSIS — C541 Malignant neoplasm of endometrium: Secondary | ICD-10-CM | POA: Diagnosis not present

## 2015-10-21 DIAGNOSIS — Z01818 Encounter for other preprocedural examination: Secondary | ICD-10-CM | POA: Insufficient documentation

## 2015-10-21 NOTE — Patient Instructions (Signed)
Goals:  Follow Phase 3B: High Protein + Non-Starchy Vegetables  Eat 3-6 small meals/snacks, every 3-5 hrs  Increase lean protein foods to meet 60g goal  Increase fluid intake to 64oz +  Avoid drinking 15 minutes before, during and 30 minutes after eating  Aim for >30 min of physical activity daily  Surgery date: 08/26/2015 Surgery type: sleeve gastrectomy Start weight at [facility]: 409 lbs on 05/30/2015 Weight today: 352 lbs Weight change: 16 lbs Total weight lost: 57  TANITA  BODY COMP RESULTS  07/28/15 09/09/15 10/21/15   BMI (kg/m^2) N/A 57.6 55.1   Fat Mass (lbs)  212.6 192.6   Fat Free Mass (lbs)  115.2 159.4   Total Body Water (lbs)  N/A 120.8

## 2015-10-21 NOTE — Progress Notes (Signed)
Follow-up visit:  8 Weeks Post-Operative Sleeve gastrectomy Surgery  Medical Nutrition Therapy:  Appt start time: 205 end time:  230  Primary concerns today: Post-operative Bariatric Surgery Nutrition Management. Valerie Aguilar returns having lost a total of 57 pounds. She is tolerating all recommended foods. Has not tried any veggies.  Non scale victories: clothes fitting better  Surgery date: 08/26/2015 Surgery type: sleeve gastrectomy Start weight at [facility]: 409 lbs on 05/30/2015 Weight today: 352 lbs Weight change: 16 lbs Total weight lost: 57 lbs  TANITA  BODY COMP RESULTS  07/28/15 09/09/15 10/21/15   BMI (kg/m^2) N/A 57.6 55.1   Fat Mass (lbs)  212.6 192.6   Fat Free Mass (lbs)  115.2 159.4   Total Body Water (lbs)  N/A 120.8    Preferred Learning Style:   No preference indicated   Learning Readiness:  Ready  24-hr recall: B (AM): Premier shake or [place] sausage or cheese or yogurt (14-30g) Snk (AM):   L (PM): lunchmeat and cheese OR leftover meat (14-21g) Snk (PM): string cheese (7g) D (PM): 2-3 oz chicken or steak or shrimp (14-21g) Snk (PM):   Fluid intake: close to 64 oz per patient (at least 50 oz), water, flavored water, protein shake Estimated total protein intake: 60 grams most days per patient  Medications: no longer taking Amlodipine or statin  Supplementation: taking, has trouble getting all 3 Calciums  Using straws: no Drinking while eating: no Hair loss: none outside of normal shedding per patient Carbonated beverages: no N/V/D/C: constipation, taking Colace, Miralax, and Benefiber  Dumping syndrome: none  Recent physical activity:  Walking 3-4x a week   Progress Towards Goal(s):  In progress.  Handouts given during visit include:  Phase 3B lean protein + non starchy vegetables   Nutritional Diagnosis:  [place]llerbe-3.3 Overweight/obesity related to past poor dietary habits and physical inactivity as evidenced by patient w/ recent sleeve gastrectomy surgery  following dietary guidelines for continued weight loss.   Intervention:  Nutrition counseling provided.  Teaching Method Utilized:  Visual Auditory Hands on  Barriers to learning/adherence to lifestyle change: none  Demonstrated degree of understanding via:  Teach Back   Monitoring/Evaluation:  Dietary intake, exercise, and body weight. Follow up in 2 months for 4 month post-op visit.

## 2015-10-22 ENCOUNTER — Encounter: Payer: Self-pay | Admitting: Dietician

## 2015-11-12 ENCOUNTER — Encounter: Payer: Self-pay | Admitting: Physician Assistant

## 2015-11-20 ENCOUNTER — Ambulatory Visit (INDEPENDENT_AMBULATORY_CARE_PROVIDER_SITE_OTHER): Payer: 59 | Admitting: Family Medicine

## 2015-11-20 VITALS — BP 135/88 | HR 67 | Wt 334.0 lb

## 2015-11-20 DIAGNOSIS — M25561 Pain in right knee: Secondary | ICD-10-CM | POA: Diagnosis not present

## 2015-11-20 MED ORDER — DICLOFENAC SODIUM 1 % TD GEL: 4.0000 g | Freq: Four times a day (QID) | TRANSDERMAL | 11 refills | Status: DC

## 2015-11-20 MED FILL — DICLOFENAC SODIUM 1% GEL: 1 | 6 days supply | Qty: 100 | Fill #0

## 2015-11-20 NOTE — Patient Instructions (Signed)
Thank you for coming in today. Call or go to the ER if you develop a large red swollen joint with extreme pain or oozing puss.  Return in 3-4 weeks if not better.    Meniscus Tear With Phase I Rehab The meniscus is a C-shaped cartilage structure, located in the knee joint between the thigh bone (femur) and the shinbone (tibia). Two menisci are located in each knee joint: the inner and outer meniscus. The meniscus acts as an adapter between the thigh bone and shinbone, allowing them to fit properly together. It also functions as a shock absorber, to reduce the stress placed on the knee joint and to help supply nutrients to the knee joint cartilage. As people age, the meniscus begins to harden and become more vulnerable to injury. Meniscus tears are a common injury, especially in older athletes. Inner meniscus tears are more common than outer meniscus tears.  SYMPTOMS   Pain in the knee, especially with standing or squatting with the affected leg.  Tenderness along the joint line.  Swelling in the knee joint (effusion), usually starting 1 to 2 days after injury.  Locking or catching of the knee joint, causing inability to straighten the knee completely.  Giving way or buckling of the knee. CAUSES  A meniscus tear occurs when a force is placed on the meniscus that is greater than it can handle. Common causes of injury include:  Direct hit (trauma) to the knee.  Twisting, pivoting, or cutting (rapidly changing direction while running), kneeling or squatting.  Without injury, due to aging. RISK INCREASES WITH:  Contact sports (football, rugby).  Sports in which cleats are used with pivoting (soccer, lacrosse) or sports in which good shoe grip and sudden change in direction are required (racquetball, basketball, squash).  Previous knee injury.  Associated knee injury, particularly ligament injuries.  Poor strength and flexibility. PREVENTION  Warm up and stretch properly before  activity.  Maintain physical fitness:  Strength, flexibility, and endurance.  Cardiovascular fitness.  Protect the knee with a brace or elastic bandage.  Wear properly fitted protective equipment (proper cleats for the surface). PROGNOSIS  Sometimes, meniscus tears heal on their own. However, definitive treatment requires surgery, followed by at least 6 weeks of recovery.  RELATED COMPLICATIONS   Recurring symptoms that result in a chronic problem.  Repeated knee injury, especially if sports are resumed too soon after injury or surgery.  Progression of the tear (the tear gets larger), if untreated.  Arthritis of the knee in later years (with or without surgery).  Complications of surgery, including infection, bleeding, injury to nerves (numbness, weakness, paralysis) continued pain, giving way, locking, nonhealing of meniscus (if repaired), need for further surgery, and knee stiffness (loss of motion). TREATMENT  Treatment first involves the use of ice and medicine, to reduce pain and inflammation. You may find using crutches to walk more comfortable. However, it is okay to bear weight on the injured knee, if the pain will allow it. Surgery is often advised as a definitive treatment. Surgery is performed through an incision near the joint (arthroscopically). The torn piece of the meniscus is removed, and if possible the joint cartilage is repaired. After surgery, the joint must be restrained. After restraint, it is important to perform strengthening and stretching exercises to help regain strength and a full range of motion. These exercises may be completed at home or with a therapist.  MEDICATION  If pain medicine is needed, nonsteroidal anti-inflammatory medicines (aspirin and ibuprofen), or other  minor pain relievers (acetaminophen), are often advised.  Do not take pain medicine for 7 days before surgery.  Prescription pain relievers may be given, if your caregiver thinks they are  needed. Use only as directed and only as much as you need. HEAT AND COLD  Cold treatment (icing) should be applied for 10 to 15 minutes every 2 to 3 hours for inflammation and pain, and immediately after activity that aggravates your symptoms. Use ice packs or an ice massage.  Heat treatment may be used before performing stretching and strengthening activities prescribed by your caregiver, physical therapist, or athletic trainer. Use a heat pack or a warm water soak. SEEK MEDICAL CARE IF:   Symptoms get worse or do not improve in 2 weeks, despite treatment.  New, unexplained symptoms develop. (Drugs used in treatment may produce side effects.) EXERCISES RANGE OF MOTION (ROM) AND STRETCHING EXERCISES - Meniscus Tear, Non-operative, Phase I These are some of the initial exercises with which you may start your rehabilitation program, until you see your caregiver again or until your symptoms are resolved. Remember:   These initial exercises are intended to be gentle. They will help you restore motion without increasing any swelling.  Completing these exercises allows less painful movement and prepares you for the more aggressive strengthening exercises in Phase II.  An effective stretch should be held for at least 30 seconds.  A stretch should never be painful. You should only feel a gentle lengthening or release in the stretched tissue. RANGE OF MOTION - Knee Flexion, Active  Lie on your back with both knees straight. (If this causes back discomfort, bend your healthy knee, placing your foot flat on the floor.)  Slowly slide your heel back toward your buttocks until you feel a gentle stretch in the front of your knee or thigh.  Hold for __________ seconds. Slowly slide your heel back to the starting position. Repeat __________ times. Complete this exercise __________ times per day.  RANGE OF MOTION - Knee Flexion and Extension, Active-Assisted  Sit on the edge of a table or chair with  your thighs firmly supported. It may be helpful to place a folded towel under the end of your right / left thigh.  Flexion (bending): Place the ankle of your healthy leg on top of the other ankle. Use your healthy leg to gently bend your right / left knee until you feel a mild tension across the top of your knee.  Hold for __________ seconds.  Extension (straightening): Switch your ankles so your right / left leg is on top. Use your healthy leg to straighten your right / left knee until you feel a mild tension on the backside of your knee.  Hold for __________ seconds. Repeat __________ times. Complete __________ times per day. STRETCH - Knee Flexion, Supine  Lie on the floor with your right / left heel and foot lightly touching the wall. (Place both feet on the wall if you do not use a door frame.)  Without using any effort, allow gravity to slide your foot down the wall slowly until you feel a gentle stretch in the front of your right / left knee.  Hold this stretch for __________ seconds. Then return the leg to the starting position, using your healthy leg for help, if needed. Repeat __________ times. Complete this stretch __________ times per day.  STRETCH - Knee Extension Sitting  Sit with your right / left leg/heel propped on another chair, coffee table, or foot stool.  Allow your leg muscles to relax, letting gravity straighten out your knee.*  You should feel a stretch behind your right / left knee. Hold this position for __________ seconds. Repeat __________ times. Complete this stretch __________ times per day.  *Your physician, physical therapist or athletic trainer may instruct you place a __________ weight on your thigh, just above your kneecap, to deepen the stretch.  STRENGTHENING EXERCISES - Meniscus Tear, Non-operative, Phase I These exercises may help you when beginning to rehabilitate your injury. They may resolve your symptoms with or without further involvement from  your physician, physical therapist or athletic trainer. While completing these exercises, remember:   Muscles can gain both the endurance and the strength needed for everyday activities through controlled exercises.  Complete these exercises as instructed by your physician, physical therapist or athletic trainer. Progress the resistance and repetitions only as guided. STRENGTH - Quadriceps, Isometrics  Lie on your back with your right / left leg extended and your opposite knee bent.  Gradually tense the muscles in the front of your right / left thigh. You should see either your knee cap slide up toward your hip or increased dimpling just above the knee. This motion will push the back of the knee down toward the floor, mat, or bed on which you are lying.  Hold the muscle as tight as you can, without increasing your pain, for __________ seconds.  Relax the muscles slowly and completely between each repetition. Repeat __________ times. Complete this exercise __________ times per day.  STRENGTH - Quadriceps, Short Arcs   Lie on your back. Place a __________ inch towel roll under your right / left knee, so that the knee bends slightly.  Raise only your lower leg by tightening the muscles in the front of your thigh. Do not allow your thigh to rise.  Hold this position for __________ seconds. Repeat __________ times. Complete this exercise __________ times per day.  OPTIONAL ANKLE WEIGHTS: Begin with ____________________, but DO NOT exceed ____________________. Increase in 1 pound/0.5 kilogram increments. STRENGTH - Quadriceps, Straight Leg Raises  Quality counts! Watch for signs that the quadriceps muscle is working, to be sure you are strengthening the correct muscles and not "cheating" by substituting with healthier muscles.  Lay on your back with your right / left leg extended and your opposite knee bent.  Tense the muscles in the front of your right / left thigh. You should see either your  knee cap slide up or increased dimpling just above the knee. Your thigh may even shake a bit.  Tighten these muscles even more and raise your leg 4 to 6 inches off the floor. Hold for __________ seconds.  Keeping these muscles tense, lower your leg.  Relax the muscles slowly and completely in between each repetition. Repeat __________ times. Complete this exercise __________ times per day.  STRENGTH - Hamstring, Curls   Lay on your stomach with your legs extended. (If you lay on a bed, your feet may hang over the edge.)  Tighten the muscles in the back of your thigh to bend your right / left knee up to 90 degrees. Keep your hips flat on the bed.  Hold this position for __________ seconds.  Slowly lower your leg back to the starting position. Repeat __________ times. Complete this exercise __________ times per day.  STRENGTH - Quadriceps, Squats  Stand in a door frame so that your feet and knees are in line with the frame.  Use your hands for balance,  not support, on the frame.  Slowly lower your weight, bending at the hips and knees. Keep your lower legs upright so that they are parallel with the door frame. Squat only within the range that does not increase your knee pain. Never let your hips drop below your knees.  Slowly return upright, pushing with your legs, not pulling with your hands. Repeat __________ times. Complete this exercise __________ times per day.  STRENGTH - Quad/VMO, Isometric   Sit in a chair with your right / left knee slightly bent. With your fingertips, feel the VMO muscle just above the inside of your knee. The VMO is important in controlling the position of your kneecap.  Keeping your fingertips on this muscle. Without actually moving your leg, attempt to drive your knee down as if straightening your leg. You should feel your VMO tense. If you have a difficult time, you may wish to try the same exercise on your healthy knee first.  Tense this muscle as hard  as you can without increasing any knee pain.  Hold for __________ seconds. Relax the muscles slowly and completely in between each repetition. Repeat __________ times. Complete exercise __________ times per day.    This information is not intended to replace advice given to you by your health care provider. Make sure you discuss any questions you have with your health care provider.   Document Released: 01/18/1998 Document Revised: 05/21/2014 Document Reviewed: 04/18/2008 Elsevier Interactive Patient Education [employer].

## 2015-11-20 NOTE — Progress Notes (Signed)
Subjective:    I'm seeing this patient as a consultation for:  Iran Planas, PA-C   CC: R knee pain  HPI: Patient is a 35 y.o. F with a hx of obesity s/p gastric sleeve 2 months ago and bilateral knee OA who presents with R knee pain. Pain started 1 month ago while walking up stairs. Since then, the pain has been exacerbated by walking up stairs and standing from a sitting position. Pain was initially accompanied by swelling that has since resolved. No warmth or redness. No fever/chills.She denies any significant locking or catching. Symptoms are moderate to do interfere with activity. She's lost 50 pounds recently after her gastric sleeve is looking to increase her exercise if possible.  Past medical history, Surgical history, Family history not pertinant except as noted below, Social history, Allergies, and medications have been entered into the medical record, reviewed, and no changes needed.   Review of Systems: No headache, visual changes, nausea, vomiting, diarrhea, constipation, dizziness, abdominal pain, skin rash, fevers, chills, night sweats, weight loss, swollen lymph nodes, body aches, joint swelling, muscle aches, chest pain, shortness of breath, mood changes, visual or auditory hallucinations.   Objective:    Vitals:   11/20/15 0846  BP: 135/88  Pulse: 67   General: Well Developed, well nourished, and in no acute distress.  Neuro/Psych: Alert and oriented x3, extra-ocular muscles intact, able to move all 4 extremities, sensation grossly intact. Skin: Warm and dry, no rashes noted.  Respiratory: Not using accessory muscles, speaking in full sentences, trachea midline.  Cardiovascular: Pulses palpable, no extremity edema. Abdomen: Does not appear distended. MSK: R knee with full ROM, + McMurray's for lateral meniscus. Point tenderness along lateral joint line. No edema or erythema.   Procedure: Real-time Ultrasound Guided Injection of right knee  Device: GE Logiq E   Images permanently stored and available for review in the ultrasound unit. Verbal informed consent obtained. Discussed risks and benefits of procedure. Warned about infection bleeding damage to structures skin hypopigmentation and fat atrophy among others. Patient expresses understanding and agreement Time-out conducted.  Noted no overlying erythema, induration, or other signs of local infection.  Skin prepped in a sterile fashion.  Local anesthesia: Topical Ethyl chloride.  With sterile technique and under real time ultrasound guidance: 80 mg of Kenalog and 4 mL of Marcaine injected easily.  Completed without difficulty  Pain immediately resolved suggesting accurate placement of the medication.  Advised to call if fevers/chills, erythema, induration, drainage, or persistent bleeding.  Images permanently stored and available for review in the ultrasound unit.  Impression: Technically successful ultrasound guided injection.    MR Knee Right 09/14/14: 1. No acute findings or evidence of internal derangement. 2. Mild medial compartment and trochlear chondromalacia. No acute osseous findings. 3. Small joint effusion with nonspecific subcutaneous edema anterior medially.   No results found for this or any previous visit (from the past 24 hour(s)). No results found.  Impression and Recommendations:    Assessment and Plan: 35 y.o. female with R knee pain and swelling. Most likely lateral mensicus injury given point tenderness in area, positive McMurray's sign for lateral meniscus. Pain is unlike previous OA pain, cannot be on NSAIDs given recent gastrectomy. X-ray pending.  Steroid injection performed in office today. Gave patient diclofenac gel and demonstrated leg lifts for quad strengthening. Anticipate benefit from continued weight loss (has lost 57 lbs since sleeve gastrectomy 08/2015).   Discussed warning signs or symptoms. Please see discharge instructions. Patient  expresses understanding.

## 2015-11-28 ENCOUNTER — Other Ambulatory Visit: Payer: Self-pay | Admitting: *Deleted

## 2015-11-28 DIAGNOSIS — Z9884 Bariatric surgery status: Secondary | ICD-10-CM | POA: Diagnosis not present

## 2015-11-28 LAB — CBC WITH DIFFERENTIAL/PLATELET
BASOS ABS: 0 {cells}/uL (ref 0–200)
Basophils Relative: 0 %
EOS ABS: 0 {cells}/uL — AB (ref 15–500)
Eosinophils Relative: 0 %
HCT: 45.5 % — ABNORMAL HIGH (ref 35.0–45.0)
HEMOGLOBIN: 14.8 g/dL (ref 11.7–15.5)
LYMPHS ABS: 2184 {cells}/uL (ref 850–3900)
Lymphocytes Relative: 21 %
MCH: 28.5 pg (ref 27.0–33.0)
MCHC: 32.5 g/dL (ref 32.0–36.0)
MCV: 87.5 fL (ref 80.0–100.0)
MONO ABS: 624 {cells}/uL (ref 200–950)
MPV: 13.2 fL — ABNORMAL HIGH (ref 7.5–12.5)
Monocytes Relative: 6 %
NEUTROS ABS: 7592 {cells}/uL (ref 1500–7800)
Neutrophils Relative %: 73 %
Platelets: 199 10*3/uL (ref 140–400)
RBC: 5.2 MIL/uL — ABNORMAL HIGH (ref 3.80–5.10)
RDW: 15.3 % — ABNORMAL HIGH (ref 11.0–15.0)
WBC: 10.4 10*3/uL (ref 3.8–10.8)

## 2015-11-29 LAB — COMPREHENSIVE METABOLIC PANEL
ALK PHOS: 92 U/L (ref 33–115)
ALT: 29 U/L (ref 6–29)
AST: 22 U/L (ref 10–30)
Albumin: 3.7 g/dL (ref 3.6–5.1)
BILIRUBIN TOTAL: 0.8 mg/dL (ref 0.2–1.2)
BUN: 10 mg/dL (ref 7–25)
CO2: 20 mmol/L (ref 20–31)
CREATININE: 0.75 mg/dL (ref 0.50–1.10)
Calcium: 9.3 mg/dL (ref 8.6–10.2)
Chloride: 103 mmol/L (ref 98–110)
Glucose, Bld: 87 mg/dL (ref 65–99)
Potassium: 4.1 mmol/L (ref 3.5–5.3)
SODIUM: 138 mmol/L (ref 135–146)
TOTAL PROTEIN: 6.3 g/dL (ref 6.1–8.1)

## 2015-11-29 LAB — IRON: IRON: 30 ug/dL — AB (ref 40–190)

## 2015-11-29 LAB — VITAMIN B12: VITAMIN B 12: 338 pg/mL (ref 200–1100)

## 2015-11-29 LAB — FOLATE: Folate: 9.5 ng/mL (ref >5.4)

## 2015-11-29 LAB — FERRITIN: Ferritin: 175 ng/mL — ABNORMAL HIGH (ref 10–154)

## 2015-11-29 LAB — VITAMIN D 25 HYDROXY (VIT D DEFICIENCY, FRACTURES): Vit D, 25-Hydroxy: 24 ng/mL — ABNORMAL LOW (ref 30–100)

## 2015-12-01 LAB — VITAMIN B1: Vitamin B1 (Thiamine): <7 nmol/L — ABNORMAL LOW (ref 8–30)

## 2015-12-05 ENCOUNTER — Encounter: Payer: Self-pay | Admitting: Physician Assistant

## 2015-12-23 ENCOUNTER — Encounter: Payer: Self-pay | Admitting: Family Medicine

## 2015-12-29 ENCOUNTER — Encounter: Payer: Self-pay | Admitting: Family Medicine

## 2015-12-29 ENCOUNTER — Ambulatory Visit (INDEPENDENT_AMBULATORY_CARE_PROVIDER_SITE_OTHER): Payer: 59 | Admitting: Family Medicine

## 2015-12-29 DIAGNOSIS — M2242 Chondromalacia patellae, left knee: Secondary | ICD-10-CM

## 2015-12-29 NOTE — Progress Notes (Signed)
Valerie Aguilar is a 35 y.o. female who presents to [facility] today for left knee pain.  She has had worsening left knee pain for the past month that she says feels like worsening of her bilateral knee arthritis. She has aching above and below her knee cap, as well as popping and cracking on movement more often. Pain is worse on climbing up stairs and straightening her leg.  Her right knee pain has resolved with the injection and diclofenac gel.   Past Medical History:  Diagnosis Date  . Abrasion of right eye    irregular 06/07/2015 uses antibiotics, course completed  . Arthritis    right knee  . Cancer (HCC)    endometrial  . Hyperlipidemia   . Hypertension   . Obesity, morbid [facility])    Past Surgical History:  Procedure Laterality Date  . DILATATION & CURETTAGE/HYSTEROSCOPY WITH MYOSURE N/A 07/07/2015   Procedure: DILATATION & CURETTAGE/HYSTEROSCOPY WITH MYOSURE;  Surgeon: Aloha Gell, MD;  Location: [facility];  Service: Gynecology;  Laterality: N/A;  . DILATION AND CURETTAGE OF UTERUS    . LAPAROSCOPIC GASTRIC SLEEVE RESECTION N/A 08/26/2015   Procedure: LAPAROSCOPIC GASTRIC SLEEVE RESECTION, UPPER ENDOSCOPY;  Surgeon: Excell Seltzer, MD;  Location: [facility];  Service: General;  Laterality: N/A;  . TONSILLECTOMY    . WISDOM TOOTH EXTRACTION     Social History  Substance Use Topics  . Smoking status: Never Smoker  . Smokeless tobacco: Never Used  . Alcohol use No     ROS:  As above   Medications: Current Outpatient Prescriptions  Medication Sig Dispense Refill  . AMBULATORY NON FORMULARY MEDICATION Vitamin B    . CALCIUM PO Take by mouth 3 (three) times daily.    . diclofenac sodium (VOLTAREN) 1 % GEL Apply 4 g topically 4 (four) times daily. To affected joint. 100 g 11  . ergocalciferol (VITAMIN D2) 50000 units capsule Take 1 capsule (50,000 Units total) by mouth once a week. saturdays 12 capsule 1  . Multiple Vitamin  (MULTIVITAMIN) tablet Take 1 tablet by mouth daily.     No current facility-administered medications for this visit.    Allergies  Allergen Reactions  . Lipitor [Atorvastatin]     Foggy headed.   . Lisinopril     Dry cough     Exam:  BP 124/69   Pulse 61   Wt (!) 316 lb (143.3 kg)   BMI 49.49 kg/m  General: Well Developed, well nourished, and in no acute distress.  Neuro/Psych: Alert and oriented x3, extra-ocular muscles intact, able to move all 4 extremities, sensation grossly intact. Skin: Warm and dry, no rashes noted.  Respiratory: Not using accessory muscles, speaking in full sentences, trachea midline.  Cardiovascular: Pulses palpable, no extremity edema. Abdomen: Does not appear distended. MSK: Right knee: No edema or erythema No point tenderness to palpation Full ROM Negative McMurray's for lateral meniscus  Left knee:  No edema or erythema Point tenderness to palpation along lateral aspect of patella Full ROM 1+ retropatellar crepitations on extension Normal ligamentous exam Pain on knee extension  No results found for this or any previous visit (from the past 48 hour(s)). No results found.   Assessment and Plan: 35 y.o. 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 She cannot take NSAIDs given recent gastrectomy. She has lost over 50 lbs since the gastrectomy and continued weight loss should help the arthritis. Gave her home exercises to do. She will  call back if not better, at which time we will refer her to PT.   No orders of the defined types were placed in this encounter.   Discussed warning signs or symptoms. Please see discharge instructions. Patient expresses understanding.

## 2015-12-29 NOTE — Patient Instructions (Signed)
Thank you for coming in today. Do the exercises we discussed.  Return as needed.  Let me know if you want to do PT.   Straight leg raise Foot rotated outward straight leg raise Ball squeezes knee extension   30 reps 2-3 times a day

## 2016-01-01 MED FILL — DICLOFENAC SODIUM 1% GEL: 1 | 6 days supply | Qty: 100 | Fill #1

## 2016-01-01 MED FILL — VIT D2 1.25 MG (50,000 UNIT: 1.25 MG | 84 days supply | Qty: 12 | Fill #1

## 2016-01-22 ENCOUNTER — Ambulatory Visit: Payer: Self-pay | Admitting: Dietician

## 2016-01-30 DIAGNOSIS — C541 Malignant neoplasm of endometrium: Secondary | ICD-10-CM | POA: Diagnosis not present

## 2016-01-30 DIAGNOSIS — N858 Other specified noninflammatory disorders of uterus: Secondary | ICD-10-CM | POA: Diagnosis not present

## 2016-03-11 DIAGNOSIS — B369 Superficial mycosis, unspecified: Secondary | ICD-10-CM | POA: Diagnosis not present

## 2016-03-11 DIAGNOSIS — Z9884 Bariatric surgery status: Secondary | ICD-10-CM | POA: Diagnosis not present

## 2016-03-12 DIAGNOSIS — B369 Superficial mycosis, unspecified: Secondary | ICD-10-CM | POA: Diagnosis not present

## 2016-03-12 DIAGNOSIS — Z9884 Bariatric surgery status: Secondary | ICD-10-CM | POA: Diagnosis not present

## 2016-03-15 LAB — WBC: WBC: 6.2

## 2016-03-15 LAB — TRIGLYCERIDES: TRIGLYCERIDES: 89

## 2016-03-15 LAB — POTASSIUM: Potassium: 4.5 mmol/L

## 2016-03-15 LAB — ALT: ALT: 13

## 2016-03-15 LAB — FOLATE: Folate: 7.9

## 2016-03-15 LAB — RBC: RBC: 5.17

## 2016-03-15 LAB — IRON: IRON: 68

## 2016-03-15 LAB — PROTEIN, TOTAL: TOTAL PROTEIN: 6.3 g/dL

## 2016-03-15 LAB — SODIUM
EGFR (African American): 117
SODIUM: 142

## 2016-03-15 LAB — CHOLESTEROL, TOTAL: Cholesterol, Total: 221

## 2016-03-15 LAB — CREATININE: Creatine, Serum: 0.77

## 2016-03-15 LAB — LDL CHOLESTEROL, DIRECT: LDL CHOLESTEROL (CALC): 171

## 2016-03-15 LAB — BILIRUBIN, TOTAL: Total Bilirubin: 0.6 mg/dL

## 2016-03-15 LAB — CO2, TOTAL: CO2: 23

## 2016-03-15 LAB — PLATELET COUNT: Platelet: 240

## 2016-03-15 LAB — AST: AST: 14 U/L

## 2016-03-15 LAB — ESTIMATED GFR
EGFR (African American): 117
GFR calc non Af Amer: 102

## 2016-03-15 LAB — GLUCOSE, RANDOM: GLUCOSE: 93

## 2016-03-15 LAB — FERRITIN: FERRITIN: 173

## 2016-03-15 LAB — HEMOGLOBIN: HEMOGLOBIN: 15.3

## 2016-03-15 LAB — CHLORIDE: CHLORIDE, SERUM: 101

## 2016-03-15 LAB — ALBUMIN: Albumin: 4

## 2016-03-15 LAB — BUN/CREATININE RATIO: BUN/Creatinine Ratio: 17

## 2016-03-15 LAB — VITAMIN B1, WHOLE BLOOD: Vitamin B1 (Thiamine), Blood: 102.2

## 2016-03-15 LAB — HEMATOCRIT: HEMATOCRIT: 45 %

## 2016-03-15 LAB — ALKALINE PHOSPHATASE: ALK PHOS: 95 U/L

## 2016-03-15 LAB — CALCIUM: CALCIUM: 9.5 mg/dL

## 2016-03-15 LAB — VITAMIN B12: VITAMIN B12: 173

## 2016-03-15 LAB — BUN: BUN: 13

## 2016-03-26 DIAGNOSIS — H1711 Central corneal opacity, right eye: Secondary | ICD-10-CM | POA: Diagnosis not present

## 2016-03-26 DIAGNOSIS — H04123 Dry eye syndrome of bilateral lacrimal glands: Secondary | ICD-10-CM | POA: Diagnosis not present

## 2016-03-26 DIAGNOSIS — H1859 Other hereditary corneal dystrophies: Secondary | ICD-10-CM | POA: Diagnosis not present

## 2016-04-07 ENCOUNTER — Ambulatory Visit: Payer: Self-pay | Admitting: Physician Assistant

## 2016-04-09 ENCOUNTER — Ambulatory Visit: Payer: Self-pay | Admitting: Physician Assistant

## 2016-04-14 ENCOUNTER — Other Ambulatory Visit: Payer: Self-pay | Admitting: *Deleted

## 2016-04-14 ENCOUNTER — Encounter: Payer: Self-pay | Admitting: Physician Assistant

## 2016-04-14 ENCOUNTER — Ambulatory Visit (INDEPENDENT_AMBULATORY_CARE_PROVIDER_SITE_OTHER): Payer: 59 | Admitting: Physician Assistant

## 2016-04-14 VITALS — BP 115/72 | HR 66 | Ht 67.0 in | Wt 283.0 lb

## 2016-04-14 DIAGNOSIS — C541 Malignant neoplasm of endometrium: Secondary | ICD-10-CM | POA: Diagnosis not present

## 2016-04-14 DIAGNOSIS — D510 Vitamin B12 deficiency anemia due to intrinsic factor deficiency: Secondary | ICD-10-CM

## 2016-04-14 DIAGNOSIS — Z903 Acquired absence of stomach [part of]: Secondary | ICD-10-CM

## 2016-04-14 MED ORDER — CYANOCOBALAMIN 1000 MCG/ML IJ SOLN: 1000.0000 ug | INTRAMUSCULAR | 0 refills | Status: DC

## 2016-04-14 MED ORDER — CYANOCOBALAMIN 1000 MCG/ML IJ SOLN
1000.0000 ug | Freq: Once | INTRAMUSCULAR | Status: AC
  Administered 2016-04-14: 1000 ug via INTRAMUSCULAR

## 2016-04-14 MED ORDER — CYANOCOBALAMIN 1000 MCG/ML IJ SOLN: 1000.0000 ug | Freq: Once | INTRAMUSCULAR | 0 refills | Status: DC

## 2016-04-14 NOTE — Progress Notes (Signed)
   Subjective:    Patient ID: Valerie Aguilar, female    DOB: August 21, 1980, 36 y.o.   MRN: [id number]  HPI  Pt is a 36 yo female who presents to the clinic for 6 month follow up. Pt had gastric sleeve in august and lost 130lbs total since. She is still being followed by bariatric surgery. She is exercising by walking almost every day. Biggest change has been portion sizes. Recent labs were done and looked good except for b12 173 and LDL 171.   Endometrial cancer- followed by GYN last 2 biopsy were benign. 18 months after surgery will get mirena taken out and then have 3 cycles and attempt to conceive.      Review of Systems  All other systems reviewed and are negative.      Objective:   Physical Exam  Constitutional: She is oriented to person, place, and time. She appears well-developed and well-nourished.  HENT:  Head: Normocephalic and atraumatic.  Cardiovascular: Normal rate, regular rhythm and normal heart sounds.   Pulmonary/Chest: Effort normal and breath sounds normal.  Neurological: She is alert and oriented to person, place, and time.  Psychiatric: She has a normal mood and affect. Her behavior is normal.          Assessment & Plan:  Marland KitchenMarland KitchenDiagnoses and all orders for this visit:  Pernicious anemia -     cyanocobalamin ((VITAMIN B-12)) injection 1,000 mcg; Inject 1 mL (1,000 mcg total) into the muscle once. -     cyanocobalamin (,VITAMIN B-12,) 1000 MCG/ML injection; Inject 1 mL (1,000 mcg total) into the muscle once. Please include needles.  Endometrial adenocarcinoma (HCC)  Severe obesity (BMI >= 40) (HCC)  History of sleeve gastrectomy  BMI decreased from 57 to 44. Pt continues to exercise and lose weight.   b12 shot given today and sent b12. 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   Discussed LDL. Pt continues to work on diet and exercise. She is not interested on medications at this time. She is interested in  pregnancy and will not start until pregnant or decided not to get pregnant.   Endometrial adenocarcinoma- managed by GYN.

## 2016-04-14 NOTE — Patient Instructions (Signed)
Cyanocobalamin, Vitamin B12 injection What is this medicine? CYANOCOBALAMIN (sye an oh koe BAL a min) is a man made form of vitamin B12. Vitamin B12 is used in the growth of healthy blood cells, nerve cells, and proteins in the body. It also helps with the metabolism of fats and carbohydrates. This medicine is used to treat people who can not absorb vitamin B12. This medicine may be used for other purposes; ask your health care provider or pharmacist if you have questions. COMMON BRAND NAME(S): B-12 Compliance Kit, B-12 Injection Kit, Cyomin, LA-12, Nutri-Twelve, Physicians EZ Use B-12, Primabalt What should I tell my health care provider before I take this medicine? They need to know if you have any of these conditions: -kidney disease -Leber's disease -megaloblastic anemia -an unusual or allergic reaction to cyanocobalamin, cobalt, other medicines, foods, dyes, or preservatives -pregnant or trying to get pregnant -breast-feeding How should I use this medicine? This medicine is injected into a muscle or deeply under the skin. It is usually given by a health care professional in a clinic or doctor's office. However, your doctor may teach you how to inject yourself. Follow all instructions. Talk to your pediatrician regarding the use of this medicine in children. Special care may be needed. Overdosage: If you think you have taken too much of this medicine contact a poison control center or emergency room at once. NOTE: This medicine is only for you. Do not share this medicine with others. What if I miss a dose? If you are given your dose at a clinic or doctor's office, call to reschedule your appointment. If you give your own injections and you miss a dose, take it as soon as you can. If it is almost time for your next dose, take only that dose. Do not take double or extra doses. What may interact with this medicine? -colchicine -heavy alcohol intake This list may not describe all possible  interactions. Give your health care provider a list of all the medicines, herbs, non-prescription drugs, or dietary supplements you use. Also tell them if you smoke, drink alcohol, or use illegal drugs. Some items may interact with your medicine. What should I watch for while using this medicine? Visit your doctor or health care professional regularly. You may need blood work done while you are taking this medicine. You may need to follow a special diet. Talk to your doctor. Limit your alcohol intake and avoid smoking to get the best benefit. What side effects may I notice from receiving this medicine? Side effects that you should report to your doctor or health care professional as soon as possible: -allergic reactions like skin rash, itching or hives, swelling of the face, lips, or tongue -blue tint to skin -chest tightness, pain -difficulty breathing, wheezing -dizziness -red, swollen painful area on the leg Side effects that usually do not require medical attention (report to your doctor or health care professional if they continue or are bothersome): -diarrhea -headache This list may not describe all possible side effects. Call your doctor for medical advice about side effects. You may report side effects to FDA at 1-800-FDA-1088. Where should I keep my medicine? Keep out of the reach of children. Store at room temperature between 15 and 30 degrees C (59 and 85 degrees F). Protect from light. Throw away any unused medicine after the expiration date. NOTE: This sheet is a summary. It may not cover all possible information. If you have questions about this medicine, talk to your doctor, pharmacist, or   health care provider.  2018 Elsevier/Gold Standard (2007-04-17 22:10:20)  

## 2016-04-15 ENCOUNTER — Encounter: Payer: Self-pay | Admitting: Physician Assistant

## 2016-04-15 LAB — HDL CHOLESTEROL: HDL Cholesterol: 32

## 2016-04-15 LAB — CALCITRIOL (1,25 DI-OH VIT D): Vit D, 1,25-Dihydroxy: 68

## 2016-04-15 MED FILL — CYANOCOBALAMIN 1,000 MCG/ML: 1000 | 90 days supply | Qty: 3 | Fill #0

## 2016-05-07 DIAGNOSIS — Z3202 Encounter for pregnancy test, result negative: Secondary | ICD-10-CM | POA: Diagnosis not present

## 2016-05-07 DIAGNOSIS — C541 Malignant neoplasm of endometrium: Secondary | ICD-10-CM | POA: Diagnosis not present

## 2016-05-07 DIAGNOSIS — N858 Other specified noninflammatory disorders of uterus: Secondary | ICD-10-CM | POA: Diagnosis not present

## 2016-05-28 DIAGNOSIS — Z9884 Bariatric surgery status: Secondary | ICD-10-CM | POA: Diagnosis not present

## 2016-05-28 DIAGNOSIS — B369 Superficial mycosis, unspecified: Secondary | ICD-10-CM | POA: Diagnosis not present

## 2016-05-30 ENCOUNTER — Encounter: Payer: Self-pay | Admitting: Family Medicine

## 2016-05-31 ENCOUNTER — Encounter: Payer: Self-pay | Admitting: Family Medicine

## 2016-05-31 ENCOUNTER — Ambulatory Visit (INDEPENDENT_AMBULATORY_CARE_PROVIDER_SITE_OTHER): Payer: 59 | Admitting: Family Medicine

## 2016-05-31 ENCOUNTER — Telehealth: Payer: Self-pay

## 2016-05-31 ENCOUNTER — Ambulatory Visit (INDEPENDENT_AMBULATORY_CARE_PROVIDER_SITE_OTHER): Payer: 59

## 2016-05-31 VITALS — BP 135/84 | HR 79 | Ht 67.0 in | Wt 279.1 lb

## 2016-05-31 DIAGNOSIS — M109 Gout, unspecified: Secondary | ICD-10-CM

## 2016-05-31 DIAGNOSIS — M79674 Pain in right toe(s): Secondary | ICD-10-CM | POA: Diagnosis not present

## 2016-05-31 MED ORDER — HYDROCODONE-ACETAMINOPHEN 5-325 MG PO TABS: 1.0000 | ORAL_TABLET | Freq: Four times a day (QID) | ORAL | 0 refills | Status: DC | PRN

## 2016-05-31 MED ORDER — COLCHICINE 0.6 MG PO TABS: 0.6000 mg | ORAL_TABLET | Freq: Every day | ORAL | 1 refills | Status: DC

## 2016-05-31 NOTE — Patient Instructions (Addendum)
Thank you for coming in today. Get labs today.   Start colchicine.  Use norco as needed.   Use the cam walker as needed.  DO NOT DRIVE with the cam walker on your foot.   Recheck in a few weeks or sooner if needed.  We may be starting allopurinol or uloric based on labs.   This problem will likely resolve with further weight loss.    Gout Gout is painful swelling that can occur in some of your joints. Gout is a type of arthritis. This condition is caused by having too much uric acid in your body. Uric acid is a chemical that forms when your body breaks down substances called purines. Purines are important for building body proteins. When your body has too much uric acid, sharp crystals can form and build up inside your joints. This causes pain and swelling. Gout attacks can happen quickly and be very painful (acute gout). Over time, the attacks can affect more joints and become more frequent (chronic gout). Gout can also cause uric acid to build up under your skin and inside your kidneys. What are the causes? This condition is caused by too much uric acid in your blood. This can occur because:  Your kidneys do not remove enough uric acid from your blood. This is the most common cause.  Your body makes too much uric acid. This can occur with some cancers and cancer treatments. It can also occur if your body is breaking down too many red blood cells (hemolytic anemia).  You eat too many foods that are high in purines. These foods include organ meats and some seafood. Alcohol, especially beer, is also high in purines. A gout attack may be triggered by trauma or stress. What increases the risk? This condition is more likely to develop in people who:  Have a family history of gout.  Are female and middle-aged.  Are female and have gone through menopause.  Are obese.  Frequently drink alcohol, especially beer.  Are dehydrated.  Lose weight too quickly.  Have an organ  transplant.  Have lead poisoning.  Take certain medicines, including aspirin, cyclosporine, diuretics, levodopa, and niacin.  Have kidney disease or psoriasis. What are the signs or symptoms? An attack of acute gout happens quickly. It usually occurs in just one joint. The most common place is the big toe. Attacks often start at night. Other joints that may be affected include joints of the feet, ankle, knee, fingers, wrist, or elbow. Symptoms may include:  Severe pain.  Warmth.  Swelling.  Stiffness.  Tenderness. The affected joint may be very painful to touch.  Shiny, red, or purple skin.  Chills and fever. Chronic gout may cause symptoms more frequently. More joints may be involved. You may also have white or yellow lumps (tophi) on your hands or feet or in other areas near your joints. How is this diagnosed? This condition is diagnosed based on your symptoms, medical history, and physical exam. You may have tests, such as:  Blood tests to measure uric acid levels.  Removal of joint fluid with a needle (aspiration) to look for uric acid crystals.  X-rays to look for joint damage. How is this treated? Treatment for this condition has two phases: treating an acute attack and preventing future attacks. Acute gout treatment may include medicines to reduce pain and swelling, including:  NSAIDs.  Steroids. These are strong anti-inflammatory medicines that can be taken by mouth (orally) or injected into a joint.  Colchicine. This medicine relieves pain and swelling when it is taken soon after an attack. It can be given orally or through an IV tube. Preventive treatment may include:  Daily use of smaller doses of NSAIDs or colchicine.  Use of a medicine that reduces uric acid levels in your blood.  Changes to your diet. You may need to see a specialist about healthy eating (dietitian). Follow these instructions at home: During a Gout Attack   If directed, apply ice to the  affected area:  Put ice in a plastic bag.  Place a towel between your skin and the bag.  Leave the ice on for 20 minutes, 2-3 times a day.  Rest the joint as much as possible. If the affected joint is in your leg, you may be given crutches to use.  Raise (elevate) the affected joint above the level of your heart as often as possible.  Drink enough fluids to keep your urine clear or pale yellow.  Take over-the-counter and prescription medicines only as told by your health care provider.  Do not drive or operate heavy machinery while taking prescription pain medicine.  Follow instructions from your health care provider about eating or drinking restrictions.  Return to your normal activities as told by your health care provider. Ask your health care provider what activities are safe for you. Avoiding Future Gout Attacks   Follow a low-purine diet as told by your dietitian or health care provider. Avoid foods and drinks that are high in purines, including liver, kidney, anchovies, asparagus, herring, mushrooms, mussels, and beer.  Limit alcohol intake to no more than 1 drink a day for nonpregnant women and 2 drinks a day for men. One drink equals 12 oz of beer, 5 oz of wine, or 1 oz of hard liquor.  Maintain a healthy weight or lose weight if you are overweight. If you want to lose weight, talk with your health care provider. It is important that you do not lose weight too quickly.  Start or maintain an exercise program as told by your health care provider.  Drink enough fluids to keep your urine clear or pale yellow.  Take over-the-counter and prescription medicines only as told by your health care provider.  Keep all follow-up visits as told by your health care provider. This is important. Contact a health care provider if:  You have another gout attack.  You continue to have symptoms of a gout attack after10 days of treatment.  You have side effects from your  medicines.  You have chills or a fever.  You have burning pain when you urinate.  You have pain in your lower back or belly. Get help right away if:  You have severe or uncontrolled pain.  You cannot urinate. This information is not intended to replace advice given to you by your health care provider. Make sure you discuss any questions you have with your health care provider. Document Released: 01/02/2000 Document Revised: 06/12/2015 Document Reviewed: 10/17/2014 Elsevier Interactive Patient Education  2017 [employer].

## 2016-05-31 NOTE — Telephone Encounter (Signed)
[place] believes she has gout. She would like a uric acid level drawn before her appointment today. She works at [employer] at [employer] and the doctors there believes it is also gout. She uses [facility] and she is in [place] due to work and would like to have it drawn in before the appointment. She lives in [name]. [facility] draw site near the hospital in [place]. Please advise.

## 2016-05-31 NOTE — Telephone Encounter (Signed)
Received a My Chart message. See below. I called and left a message for patient to call back to schedule an appointment.       Message   Hi Dr. Georgina Snell,    [facility] you are well. I am writing because I have had some trouble with my right foot this weekend. On Friday I went to a hair appt and then did a little shopping in these slip on loafer type shoes that I normally don't wear long. I'm thinking i just aggravated my foot but the pain has been pretty significant. It's all in my right great toe area and on the pad of my great toe down to the arch. It is slightly swollen, no redness, but does have some heat to the area. Very tender but I am able to bend the toe some but it is painful. I was jus concerned about gout or a spur. Wasn't sure if I should come in to be seen or give it some time? Due to my job, of course I will constantly be on my feet, so giving it time to rest will be difficult. I have stayed off of it today though. I've also iced it and done one Epsom salt soak. Any recommendations are greatly appreciated.     Thanks  Maude Leriche

## 2016-05-31 NOTE — Progress Notes (Signed)
Valerie Aguilar is a 36 y.o. female who presents to [facility] [place] today for right great toe pain. Patient notes a few day history is significant right great toe pain and swelling. She denies any injury or abrasions. No fevers or chills. She has not changed her diet or activity. She has tried some over-the-counter medications for pain control as well as Voltaren gel which have not helped much.   Past Medical History:  Diagnosis Date  . Abrasion of right eye    irregular 06/07/2015 uses antibiotics, course completed  . Arthritis    right knee  . Cancer (HCC)    endometrial  . Hyperlipidemia   . Hypertension   . Obesity, morbid [facility])    Past Surgical History:  Procedure Laterality Date  . DILATATION & CURETTAGE/HYSTEROSCOPY WITH MYOSURE N/A 07/07/2015   Procedure: DILATATION & CURETTAGE/HYSTEROSCOPY WITH MYOSURE;  Surgeon: Aloha Gell, MD;  Location: [facility];  Service: Gynecology;  Laterality: N/A;  . DILATION AND CURETTAGE OF UTERUS    . LAPAROSCOPIC GASTRIC SLEEVE RESECTION N/A 08/26/2015   Procedure: LAPAROSCOPIC GASTRIC SLEEVE RESECTION, UPPER ENDOSCOPY;  Surgeon: Excell Seltzer, MD;  Location: [facility];  Service: General;  Laterality: N/A;  . TONSILLECTOMY    . WISDOM TOOTH EXTRACTION     Social History  Substance Use Topics  . Smoking status: Never Smoker  . Smokeless tobacco: Never Used  . Alcohol use No   family history includes COPD in her mother; Cancer in her paternal uncle; Diabetes in her father and paternal grandmother; Hypertension in her father and paternal grandmother; Stroke in her paternal aunt.  ROS as above:  Medications: Current Outpatient Prescriptions  Medication Sig Dispense Refill  . cyanocobalamin (,VITAMIN B-12,) 1000 MCG/ML injection Inject 1 mL (1,000 mcg total) into the muscle every 30 (thirty) days. Please include needles. 10 mL  0  . diclofenac sodium (VOLTAREN) 1 % GEL Apply 4 g topically 4 (four) times daily. To affected joint. 100 g 11  . Multiple Vitamin (MULTIVITAMIN) tablet Take 1 tablet by mouth daily.    . colchicine 0.6 MG tablet Take 1 tablet (0.6 mg total) by mouth daily. 30 tablet 1  . HYDROcodone-acetaminophen (NORCO/VICODIN) 5-325 MG tablet Take 1 tablet by mouth every 6 (six) hours as needed. 10 tablet 0   No current facility-administered medications for this visit.    Allergies  Allergen Reactions  . Lipitor [Atorvastatin]     Foggy headed.   . Lisinopril     Dry cough    Health Maintenance Health Maintenance  Topic Date Due  . INFLUENZA VACCINE  08/18/2016  . PAP SMEAR  02/25/2018  . TETANUS/TDAP  01/19/2020  . HIV Screening  Completed     Exam:  BP 135/84   Pulse 79   Ht 5\' 7"  (1.702 m)   Wt 279 lb 1.6 oz (126.6 kg)   SpO2 98%   BMI 43.71 kg/m   Wt Readings from Last 10 Encounters:  05/31/16 279 lb 1.6 oz (126.6 kg)  04/14/16 283 lb (128.4 kg)  12/29/15 (!) 316 lb (143.3 kg)  11/20/15 (!) 334 lb (151.5 kg)  10/22/15 (!) 352 lb (159.7 kg)  10/13/15 (!) 359 lb (162.8 kg)  09/09/15 (!) 367 lb 12.8 oz (166.8 kg)  08/26/15 (!) 387 lb 6 oz (175.7 kg)  08/20/15 (!) 392 lb (177.8 kg)  07/30/15 (!) 408 lb 1.6 oz (185.1 kg)    Gen: Well NAD  HEENT: EOMI,  MMM Lungs: Normal work of breathing. CTABL Heart: RRR no MRG Abd: NABS, Soft. Nondistended, Nontender Exts: Brisk capillary refill, warm and well perfused.  Right foot swollen and tender first MTP with some skin erythema. No skin breakdown. The skin itself is nontender with no induration. Pain with toe motion.  X-ray right great toe shows no obvious acute abnormalities. Awaiting formal radiology review   Assessment and Plan: 36 y.o. female with  Right great toe pain likely gout. Doubtful for any acute abnormalities or skin infection. Awaiting x-ray report. We'll check uric acid CMP and CBC and treat empirically with  colchicine and Norco as well as cam walker boot. Recheck if not better.   Orders Placed This Encounter  Procedures  . DG Toe Great Right    Order Specific Question:   Reason for exam:    Answer:   toe pain. Include sesmoid view    Order Specific Question:   Is the patient pregnant?    Answer:   No    Order Specific Question:   Preferred imaging location?    Answer:   [facility]  . Uric acid  . COMPLETE METABOLIC PANEL WITH GFR  . CBC   Meds ordered this encounter  Medications  . colchicine 0.6 MG tablet    Sig: Take 1 tablet (0.6 mg total) by mouth daily.    Dispense:  30 tablet    Refill:  1  . HYDROcodone-acetaminophen (NORCO/VICODIN) 5-325 MG tablet    Sig: Take 1 tablet by mouth every 6 (six) hours as needed.    Dispense:  10 tablet    Refill:  0     Discussed warning signs or symptoms. Please see discharge instructions. Patient expresses understanding.

## 2016-06-01 ENCOUNTER — Encounter: Payer: Self-pay | Admitting: Family Medicine

## 2016-06-01 LAB — COMPLETE METABOLIC PANEL WITH GFR
ALBUMIN: 4 g/dL (ref 3.6–5.1)
ALK PHOS: 82 U/L (ref 33–115)
ALT: 8 U/L (ref 6–29)
AST: 14 U/L (ref 10–30)
BUN: 17 mg/dL (ref 7–25)
CALCIUM: 9.1 mg/dL (ref 8.6–10.2)
CHLORIDE: 104 mmol/L (ref 98–110)
CO2: 21 mmol/L (ref 20–31)
Creat: 0.69 mg/dL (ref 0.50–1.10)
Glucose, Bld: 90 mg/dL (ref 65–99)
POTASSIUM: 4.3 mmol/L (ref 3.5–5.3)
Sodium: 137 mmol/L (ref 135–146)
Total Bilirubin: 0.7 mg/dL (ref 0.2–1.2)
Total Protein: 6.5 g/dL (ref 6.1–8.1)

## 2016-06-01 LAB — CBC
HEMATOCRIT: 45 % (ref 35.0–45.0)
HEMOGLOBIN: 14.7 g/dL (ref 11.7–15.5)
MCH: 28.4 pg (ref 27.0–33.0)
MCHC: 32.7 g/dL (ref 32.0–36.0)
MCV: 86.9 fL (ref 80.0–100.0)
MPV: 11.1 fL (ref 7.5–12.5)
Platelets: 252 10*3/uL (ref 140–400)
RBC: 5.18 MIL/uL — ABNORMAL HIGH (ref 3.80–5.10)
RDW: 13.5 % (ref 11.0–15.0)
WBC: 11.1 10*3/uL — AB (ref 3.8–10.8)

## 2016-06-01 LAB — URIC ACID: Uric Acid, Serum: 6.2 mg/dL (ref 2.5–7.0)

## 2016-06-01 MED ORDER — COLCHICINE 0.6 MG PO CAPS: 1.0000 | ORAL_CAPSULE | Freq: Every day | ORAL | 1 refills | Status: DC

## 2016-06-01 MED ORDER — COLCHICINE-PROBENECID 0.5-500 MG PO TABS: 1.0000 | ORAL_TABLET | Freq: Every day | ORAL | 1 refills | Status: DC

## 2016-06-01 MED FILL — HYDROCODON-APAP 5-325: 5-325 | 3 days supply | Qty: 10 | Fill #0

## 2016-06-01 NOTE — Addendum Note (Signed)
Addended by: Gregor Hams on: 06/01/2016 03:15 PM   Modules accepted: Orders

## 2016-06-29 ENCOUNTER — Ambulatory Visit: Payer: Self-pay | Admitting: Family Medicine

## 2016-07-02 ENCOUNTER — Encounter: Payer: Self-pay | Admitting: Physician Assistant

## 2016-07-02 NOTE — Telephone Encounter (Signed)
Ok to place order for b12 under [facility] for patient to pick up lab slip.

## 2016-07-06 ENCOUNTER — Telehealth: Payer: Self-pay | Admitting: *Deleted

## 2016-07-06 DIAGNOSIS — D510 Vitamin B12 deficiency anemia due to intrinsic factor deficiency: Secondary | ICD-10-CM

## 2016-07-06 NOTE — Telephone Encounter (Signed)
Lab ordered for [facility] to have b12 levels checked.

## 2016-08-13 DIAGNOSIS — Z3202 Encounter for pregnancy test, result negative: Secondary | ICD-10-CM | POA: Diagnosis not present

## 2016-08-13 DIAGNOSIS — C541 Malignant neoplasm of endometrium: Secondary | ICD-10-CM | POA: Diagnosis not present

## 2016-08-13 DIAGNOSIS — N858 Other specified noninflammatory disorders of uterus: Secondary | ICD-10-CM | POA: Diagnosis not present

## 2016-09-01 DIAGNOSIS — C541 Malignant neoplasm of endometrium: Secondary | ICD-10-CM | POA: Diagnosis not present

## 2016-09-01 DIAGNOSIS — Z30432 Encounter for removal of intrauterine contraceptive device: Secondary | ICD-10-CM | POA: Diagnosis not present

## 2016-09-01 DIAGNOSIS — N83209 Unspecified ovarian cyst, unspecified side: Secondary | ICD-10-CM | POA: Diagnosis not present

## 2016-09-08 DIAGNOSIS — E669 Obesity, unspecified: Secondary | ICD-10-CM | POA: Diagnosis not present

## 2016-09-08 DIAGNOSIS — Z9884 Bariatric surgery status: Secondary | ICD-10-CM | POA: Diagnosis not present

## 2016-10-15 ENCOUNTER — Ambulatory Visit: Payer: Self-pay | Admitting: Physician Assistant

## 2016-11-11 ENCOUNTER — Encounter: Payer: Self-pay | Admitting: Family Medicine

## 2016-11-11 DIAGNOSIS — M25562 Pain in left knee: Secondary | ICD-10-CM

## 2016-11-12 ENCOUNTER — Other Ambulatory Visit: Payer: Self-pay | Admitting: *Deleted

## 2016-11-12 DIAGNOSIS — Z9884 Bariatric surgery status: Secondary | ICD-10-CM | POA: Diagnosis not present

## 2016-11-15 LAB — CBC WITH DIFFERENTIAL/PLATELET
BASOS ABS: 0 10*3/uL (ref 0.0–0.2)
Basos: 0 %
EOS (ABSOLUTE): 0.1 10*3/uL (ref 0.0–0.4)
EOS: 1 %
HEMATOCRIT: 43.5 % (ref 34.0–46.6)
Hemoglobin: 14.8 g/dL (ref 11.1–15.9)
IMMATURE GRANULOCYTES: 0 %
Immature Grans (Abs): 0 10*3/uL (ref 0.0–0.1)
LYMPHS ABS: 1.8 10*3/uL (ref 0.7–3.1)
Lymphs: 31 %
MCH: 29.7 pg (ref 26.6–33.0)
MCHC: 34 g/dL (ref 31.5–35.7)
MCV: 87 fL (ref 79–97)
MONOCYTES: 5 %
MONOS ABS: 0.3 10*3/uL (ref 0.1–0.9)
NEUTROS PCT: 63 %
Neutrophils Absolute: 3.5 10*3/uL (ref 1.4–7.0)
Platelets: 187 10*3/uL (ref 150–379)
RBC: 4.98 x10E6/uL (ref 3.77–5.28)
RDW: 13 % (ref 12.3–15.4)
WBC: 5.6 10*3/uL (ref 3.4–10.8)

## 2016-11-15 LAB — LIPID PANEL W/O CHOL/HDL RATIO
CHOLESTEROL TOTAL: 187 mg/dL (ref 100–199)
HDL: 38 mg/dL — ABNORMAL LOW (ref >39)
LDL CALC: 137 mg/dL — AB (ref 0–99)
Triglycerides: 60 mg/dL (ref 0–149)
VLDL Cholesterol Cal: 12 mg/dL (ref 5–40)

## 2016-11-15 LAB — VITAMIN D 25 HYDROXY (VIT D DEFICIENCY, FRACTURES): VIT D 25 HYDROXY: 19.3 ng/mL — AB (ref 30.0–100.0)

## 2016-11-15 LAB — ZINC: ZINC: 64 ug/dL (ref 56–134)

## 2016-11-15 LAB — COMPREHENSIVE METABOLIC PANEL
ALK PHOS: 78 IU/L (ref 39–117)
ALT: 9 IU/L (ref 0–32)
AST: 15 IU/L (ref 0–40)
Albumin/Globulin Ratio: 2.1 (ref 1.2–2.2)
Albumin: 4.1 g/dL (ref 3.5–5.5)
BUN / CREAT RATIO: 22 (ref 9–23)
BUN: 15 mg/dL (ref 6–20)
Bilirubin Total: 0.6 mg/dL (ref 0.0–1.2)
CALCIUM: 9 mg/dL (ref 8.7–10.2)
CHLORIDE: 103 mmol/L (ref 96–106)
CO2: 25 mmol/L (ref 20–29)
Creatinine, Ser: 0.67 mg/dL (ref 0.57–1.00)
GFR calc Af Amer: 133 mL/min/{1.73_m2} (ref >59)
GFR calc non Af Amer: 115 mL/min/{1.73_m2} (ref >59)
GLUCOSE: 90 mg/dL (ref 65–99)
Globulin, Total: 2 g/dL (ref 1.5–4.5)
Potassium: 4.7 mmol/L (ref 3.5–5.2)
SODIUM: 141 mmol/L (ref 134–144)
Total Protein: 6.1 g/dL (ref 6.0–8.5)

## 2016-11-15 LAB — IRON: Iron: 70 ug/dL (ref 27–159)

## 2016-11-15 LAB — VITAMIN A: Vitamin A: 23 ug/dL — ABNORMAL LOW (ref 31.2–89.1)

## 2016-11-15 LAB — FOLATE: Folate: 11 ng/mL (ref >3.0)

## 2016-11-15 LAB — SELENIUM SERUM: Selenium, S/P: 144 ug/L (ref 91–198)

## 2016-11-15 LAB — TSH: TSH: 3.34 u[IU]/mL (ref 0.450–4.500)

## 2016-11-15 LAB — CALCITRIOL (1,25 DI-OH VIT D): VIT D 1 25 DIHYDROXY: 29.3 pg/mL (ref 19.9–79.3)

## 2016-11-15 LAB — COPPER, SERUM: COPPER: 106 ug/dL (ref 72–166)

## 2016-11-15 LAB — VITAMIN B12: Vitamin B-12: 216 pg/mL — ABNORMAL LOW (ref 232–1245)

## 2016-11-15 LAB — HGB A1C W/O EAG: HEMOGLOBIN A1C: 4.7 % — AB (ref 4.8–5.6)

## 2016-11-15 LAB — VITAMIN B1: Thiamine: 79.6 nmol/L (ref 66.5–200.0)

## 2016-11-16 MED FILL — MEDROXYPROGESTERONE 10 MG T: 10 | 10 days supply | Qty: 20 | Fill #0

## 2016-11-19 ENCOUNTER — Encounter: Payer: Self-pay | Admitting: Physician Assistant

## 2016-11-26 ENCOUNTER — Ambulatory Visit: Payer: Self-pay | Admitting: Family Medicine

## 2016-11-26 ENCOUNTER — Encounter: Payer: Self-pay | Admitting: Physician Assistant

## 2016-11-26 ENCOUNTER — Ambulatory Visit (INDEPENDENT_AMBULATORY_CARE_PROVIDER_SITE_OTHER): Payer: 59 | Admitting: Physician Assistant

## 2016-11-26 VITALS — BP 106/68 | HR 75 | Ht 67.01 in | Wt 254.0 lb

## 2016-11-26 DIAGNOSIS — Z9884 Bariatric surgery status: Secondary | ICD-10-CM | POA: Diagnosis not present

## 2016-11-26 DIAGNOSIS — E538 Deficiency of other specified B group vitamins: Secondary | ICD-10-CM | POA: Diagnosis not present

## 2016-11-26 DIAGNOSIS — E559 Vitamin D deficiency, unspecified: Secondary | ICD-10-CM | POA: Diagnosis not present

## 2016-11-26 DIAGNOSIS — D510 Vitamin B12 deficiency anemia due to intrinsic factor deficiency: Secondary | ICD-10-CM | POA: Diagnosis not present

## 2016-11-26 MED ORDER — ERGOCALCIFEROL 1.25 MG (50000 UT) PO CAPS: 50000.0000 [IU] | ORAL_CAPSULE | ORAL | 2 refills | Status: DC

## 2016-11-26 MED ORDER — CYANOCOBALAMIN 1000 MCG/ML IJ SOLN: 1000.0000 ug | INTRAMUSCULAR | 1 refills | Status: DC

## 2016-11-26 MED FILL — VIT D2 1.25 MG (50,000 UNIT: 1.25 MG | 84 days supply | Qty: 12 | Fill #0

## 2016-11-26 MED FILL — CYANOCOBALAMIN 1,000 MCG/ML: 1000 | 84 days supply | Qty: 3 | Fill #0

## 2016-11-26 NOTE — Progress Notes (Signed)
   Subjective:    Patient ID: Valerie Aguilar, female    DOB: 11/06/1980, 36 y.o.   MRN: [id number]  HPI  Pt is a 36 yo obese female who presents to the clinic to start b12 shots due to low b12 level checked at bariatric. Pt is s/p gastric sleeve she has lost down from over 400lbs to 254. She continues to walk and make healthy diet choices. She continues to lose but much slower now.   She is working with OB to get pregnant as well which trying to make sure endometrial cancer does not return.   b12 level was 216 on labs. She previously had a friend do b12 shot and wants to do that again.     .. Active Ambulatory Problems    Diagnosis Date Noted  . Pre-diabetes 05/18/2013  . Hyperlipidemia 06/04/2013  . Severe obesity (BMI >= 40) ([place]) 06/04/2013  . Right knee pain 09/25/2014  . Vitamin D deficiency 04/21/2015  . 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 Pernicious anemia 04/14/2016  . 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 B12 deficiency 11/29/2016   Resolved Ambulatory Problems    Diagnosis Date Noted  . Essential hypertension, benign 05/11/2013  . Morbid obesity with body mass index (BMI) of 60.0 to 69.9 in adult [facility]) 08/26/2015   Past Medical History:  Diagnosis Date  . Abrasion of right eye   . Arthritis   . Cancer ([place])   . Hyperlipidemia   . Hypertension   . Obesity, morbid ([place])       Review of Systems  All other systems reviewed and are negative.      Objective:   Physical Exam  Constitutional: She is oriented to person, place, and time. She appears well-developed and well-nourished.  Obese.   HENT:  Head: Normocephalic and atraumatic.  Cardiovascular: Normal rate, regular rhythm and normal heart sounds.  Pulmonary/Chest: Effort normal and breath sounds normal.  Neurological: She is alert and oriented to person, place, and time.  Psychiatric: She has a normal mood and affect. Her behavior is  normal.          Assessment & Plan:  .Marland KitchenSalwa was seen today for follow-up.  Diagnoses and all orders for this visit:  S/P laparoscopic sleeve gastrectomy  B12 deficiency -     cyanocobalamin (,VITAMIN B-12,) 1000 MCG/ML injection; Inject 1 mL (1,000 mcg total) every 30 (thirty) days into the muscle. Please include needles.  Vitamin D deficiency -     ergocalciferol (VITAMIN D2) 50000 units capsule; Take 1 capsule (50,000 Units total) once a week by mouth.  Pernicious anemia -     cyanocobalamin (,VITAMIN B-12,) 1000 MCG/ML injection; Inject 1 mL (1,000 mcg total) every 30 (thirty) days into the muscle. Please include needles.   Restart high dose vitamin D weekly and b12 montly shots. Repeat labs in 2 months.

## 2016-11-29 DIAGNOSIS — E538 Deficiency of other specified B group vitamins: Secondary | ICD-10-CM | POA: Insufficient documentation

## 2016-11-30 ENCOUNTER — Encounter: Payer: Self-pay | Admitting: Physician Assistant

## 2016-12-16 DIAGNOSIS — N915 Oligomenorrhea, unspecified: Secondary | ICD-10-CM | POA: Diagnosis not present

## 2016-12-16 DIAGNOSIS — Z3149 Encounter for other procreative investigation and testing: Secondary | ICD-10-CM | POA: Diagnosis not present

## 2016-12-16 MED FILL — LETROZOLE 2.5 MG TABLET: 2.5 | 5 days supply | Qty: 10 | Fill #0

## 2017-01-04 DIAGNOSIS — Z3149 Encounter for other procreative investigation and testing: Secondary | ICD-10-CM | POA: Diagnosis not present

## 2017-01-20 MED FILL — MEDROXYPROGESTERONE 10 MG T: 10 | 10 days supply | Qty: 20 | Fill #0

## 2017-01-21 ENCOUNTER — Ambulatory Visit: Payer: Self-pay | Admitting: Emergency Medicine

## 2017-01-21 ENCOUNTER — Encounter: Payer: Self-pay | Admitting: Physician Assistant

## 2017-01-21 VITALS — BP 134/84 | HR 88 | Temp 98.7°F | Resp 17 | Wt 260.6 lb

## 2017-01-21 DIAGNOSIS — J069 Acute upper respiratory infection, unspecified: Secondary | ICD-10-CM

## 2017-01-21 DIAGNOSIS — B9789 Other viral agents as the cause of diseases classified elsewhere: Secondary | ICD-10-CM

## 2017-01-21 MED ORDER — HYDROCODONE-HOMATROPINE 5-1.5 MG/5ML PO SYRP: 5.0000 mL | ORAL_SOLUTION | Freq: Three times a day (TID) | ORAL | 0 refills | Status: DC | PRN

## 2017-01-21 MED ORDER — AZITHROMYCIN 250 MG PO TABS: ORAL_TABLET | ORAL | 0 refills | Status: DC

## 2017-01-21 MED ORDER — PREDNISONE 20 MG PO TABS: 20.0000 mg | ORAL_TABLET | Freq: Every day | ORAL | 0 refills | Status: DC

## 2017-01-21 MED FILL — predniSONE 20 MG TABS: 20 | 6 days supply | Qty: 6 | Fill #0

## 2017-01-21 MED FILL — AZITHROMYCIN 250 MG TABLET: 250 | 5 days supply | Qty: 6 | Fill #0

## 2017-01-21 NOTE — Progress Notes (Signed)
Subjective:  Valerie Aguilar is a 37 y.o. female who presents for evaluation of URI like symptoms.  Symptoms include congestion, cough described as nonproductive, headache described as dull and achy, nasal congestion, no fever, purulent nasal drainage, sinus pain and sore throat.  Onset of symptoms was 5 days ago, and has been unchanged since that time.  Treatment to date:  none.  High risk factors for influenza complications:  none.  The following portions of the patient's history were reviewed and updated as appropriate:  allergies, current medications and past medical history.  Pertinent items noted in HPI and remainder of comprehensive ROS otherwise negative. Objective:  BP 134/84 (BP Location: Right Arm, Patient Position: Sitting, Cuff Size: Large)   Pulse 88   Temp 98.7 F (37.1 C) (Oral)   Resp 17   Wt 260 lb 9.6 oz (118.2 kg)   SpO2 97%   BMI 40.81 kg/m  General appearance: alert, cooperative and appears stated age Head: Normocephalic, without obvious abnormality, atraumatic Eyes: negative Ears: normal TM's and external ear canals both ears Nose: Nares normal. Septum midline. Mucosa normal. No drainage or sinus tenderness. Throat: lips, mucosa, and tongue normal; teeth and gums normal Neck: no adenopathy Lungs: clear to auscultation bilaterally Heart: regular rate and rhythm Pulses: 2+ and symmetric    Assessment:  viral upper respiratory illness    Plan:  Discussed diagnosis and treatment of URI. Discussed the importance of avoiding unnecessary antibiotic therapy. Educational material distributed and questions answered. Suggested symptomatic OTC remedies. Suggested brief course of Afrin or similar. Supportive care with appropriate antipyretics and fluids. Nasal saline spray for congestion. Follow up with PCP in 1 week or as needed. Given delay fill rx of Azithromycin, recommend against filling for 4-5 days.

## 2017-01-21 NOTE — Patient Instructions (Signed)

## 2017-01-23 ENCOUNTER — Telehealth: Payer: Self-pay

## 2017-01-23 NOTE — Telephone Encounter (Signed)
Courtesy call - left message on vm - calling to see how she was doing. Advised to follow up with her PCP or our office as needed. Advised to contact our office should she have any questions or concerns.

## 2017-02-11 DIAGNOSIS — E288 Other ovarian dysfunction: Secondary | ICD-10-CM | POA: Diagnosis not present

## 2017-02-11 DIAGNOSIS — Z31430 Encounter of female for testing for genetic disease carrier status for procreative management: Secondary | ICD-10-CM | POA: Diagnosis not present

## 2017-02-11 DIAGNOSIS — E2839 Other primary ovarian failure: Secondary | ICD-10-CM | POA: Diagnosis not present

## 2017-02-11 DIAGNOSIS — Z319 Encounter for procreative management, unspecified: Secondary | ICD-10-CM | POA: Diagnosis not present

## 2017-02-11 MED FILL — OVIDREL 250 MCG/0.5 ML SYRG: 250 | 1 days supply | Qty: 1 | Fill #0

## 2017-02-18 DIAGNOSIS — Z319 Encounter for procreative management, unspecified: Secondary | ICD-10-CM | POA: Diagnosis not present

## 2017-02-24 MED FILL — LETROZOLE 2.5 MG TABLET: 2.5 | 5 days supply | Qty: 15 | Fill #0

## 2017-03-07 DIAGNOSIS — Z319 Encounter for procreative management, unspecified: Secondary | ICD-10-CM | POA: Diagnosis not present

## 2017-03-07 MED FILL — OVIDREL 250 MCG/0.5 ML SYRG: 250 | 1 days supply | Qty: 1 | Fill #1

## 2017-03-11 DIAGNOSIS — Z319 Encounter for procreative management, unspecified: Secondary | ICD-10-CM | POA: Diagnosis not present

## 2017-03-11 DIAGNOSIS — E2839 Other primary ovarian failure: Secondary | ICD-10-CM | POA: Diagnosis not present

## 2017-03-11 DIAGNOSIS — N970 Female infertility associated with anovulation: Secondary | ICD-10-CM | POA: Diagnosis not present

## 2017-03-18 DIAGNOSIS — N970 Female infertility associated with anovulation: Secondary | ICD-10-CM | POA: Diagnosis not present

## 2017-03-18 DIAGNOSIS — Z319 Encounter for procreative management, unspecified: Secondary | ICD-10-CM | POA: Diagnosis not present

## 2017-03-18 DIAGNOSIS — E2839 Other primary ovarian failure: Secondary | ICD-10-CM | POA: Diagnosis not present

## 2017-04-01 MED FILL — PROGESTERONE 200 MG CAPSULE: 200 | 30 days supply | Qty: 60 | Fill #0

## 2017-04-01 MED FILL — LETROZOLE 2.5 MG TABLET: 2.5 | 5 days supply | Qty: 15 | Fill #0

## 2017-04-02 DIAGNOSIS — N970 Female infertility associated with anovulation: Secondary | ICD-10-CM | POA: Diagnosis not present

## 2017-04-02 DIAGNOSIS — E2839 Other primary ovarian failure: Secondary | ICD-10-CM | POA: Diagnosis not present

## 2017-04-02 DIAGNOSIS — N83299 Other ovarian cyst, unspecified side: Secondary | ICD-10-CM | POA: Diagnosis not present

## 2017-05-17 MED FILL — DOXYCYCLINE HYCLATE 100 MG: 100 | 7 days supply | Qty: 14 | Fill #0

## 2017-06-10 ENCOUNTER — Encounter: Payer: Self-pay | Admitting: Physician Assistant

## 2017-06-10 ENCOUNTER — Ambulatory Visit (INDEPENDENT_AMBULATORY_CARE_PROVIDER_SITE_OTHER): Payer: 59 | Admitting: Physician Assistant

## 2017-06-10 VITALS — BP 138/69 | HR 87 | Temp 98.3°F | Ht 67.0 in | Wt 286.0 lb

## 2017-06-10 DIAGNOSIS — Z0000 Encounter for general adult medical examination without abnormal findings: Secondary | ICD-10-CM | POA: Diagnosis not present

## 2017-06-10 NOTE — Patient Instructions (Signed)

## 2017-06-10 NOTE — Progress Notes (Signed)
Subjective:     Valerie Aguilar is a 37 y.o. female and is here for a comprehensive physical exam. The patient reports pt is s/p gastric sleeve. she has started to gain weight back but she just stopped hormones for fertility in april. she is exericsing and keeping to the diet plan. she is taking all of her supplements. she did not get pregnant and they are taking a break. she did get into nursing school. .  Social History   Socioeconomic History  . Marital status: Married    Spouse name: Not on file  . Number of children: Not on file  . Years of education: Not on file  . Highest education level: Not on file  Occupational History  . Not on file  Social Needs  . Financial resource strain: Not on file  . Food insecurity:    Worry: Not on file    Inability: Not on file  . Transportation needs:    Medical: Not on file    Non-medical: Not on file  Tobacco Use  . Smoking status: Never Smoker  . Smokeless tobacco: Never Used  Substance and Sexual Activity  . Alcohol use: No  . Drug use: No  . Sexual activity: Yes    Birth control/protection: None  Lifestyle  . Physical activity:    Days per week: Not on file    Minutes per session: Not on file  . Stress: Not on file  Relationships  . Social connections:    Talks on phone: Not on file    Gets together: Not on file    Attends religious service: Not on file    Active member of club or organization: Not on file    Attends meetings of clubs or organizations: Not on file    Relationship status: Not on file  . Intimate partner violence:    Fear of current or ex partner: Not on file    Emotionally abused: Not on file    Physically abused: Not on file    Forced sexual activity: Not on file  Other Topics Concern  . Not on file  Social History Narrative  . Not on file   Health Maintenance  Topic Date Due  . INFLUENZA VACCINE  08/18/2017  . PAP SMEAR  02/25/2018  . TETANUS/TDAP  01/19/2020  . HIV Screening  Completed     The following portions of the patient's history were reviewed and updated as appropriate: allergies, current medications, past family history, past medical history, past social history, past surgical history and problem list.  Review of Systems Pertinent items noted in HPI and remainder of comprehensive ROS otherwise negative.   Objective:    BP 138/69   Pulse 87   Temp 98.3 F (36.8 C)   Ht 5\' 7"  (1.702 m)   Wt 286 lb (129.7 kg)   BMI 44.79 kg/m  General appearance: alert, cooperative and morbidly obese Head: Normocephalic, without obvious abnormality, atraumatic Eyes: conjunctivae/corneas clear. PERRL, EOM's intact. Fundi benign. Ears: normal TM's and external ear canals both ears Nose: Nares normal. Septum midline. Mucosa normal. No drainage or sinus tenderness. Throat: lips, mucosa, and tongue normal; teeth and gums normal Neck: no adenopathy, no carotid bruit, no JVD, supple, symmetrical, trachea midline and thyroid not enlarged, symmetric, no tenderness/mass/nodules Back: symmetric, no curvature. ROM normal. No CVA tenderness. Lungs: clear to auscultation bilaterally Breasts: normal appearance, no masses or tenderness Heart: regular rate and rhythm, S1, S2 normal, no murmur, click, rub or gallop  Abdomen: soft, non-tender; bowel sounds normal; no masses,  no organomegaly Extremities: extremities normal, atraumatic, no cyanosis or edema Pulses: 2+ and symmetric Skin: Skin color, texture, turgor normal. No rashes or lesions Lymph nodes: Cervical, supraclavicular, and axillary nodes normal. Neurologic: Alert and oriented X 3, normal strength and tone. Normal symmetric reflexes. Normal coordination and gait    Assessment:    Healthy female exam.      Plan:    .Marland KitchenIlene was seen today for annual exam.  Diagnoses and all orders for this visit:  Routine physical examination   .Marland Kitchen Depression screen [facility] 2/9 06/10/2017 11/26/2016 04/14/2016 10/22/2015 09/09/2015  Decreased  Interest 0 0 0 0 0  Down, Depressed, Hopeless 0 0 0 0 0  PHQ - 2 Score 0 0 0 0 0   Pt had labs in October 2018. Due to see bariatric surgeon in august and states he will do labs.   Filled out paperwork for nursing schools. Immunizations up to date. Td booster due next year.   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 No family hx of breast cancer. No need for mammogram until 42.   .. Discussed 150 minutes of exercise a week.  Encouraged vitamin D 1000 units and Calcium 1300mg  or 4 servings of dairy a day.    See After Visit Summary for Counseling Recommendations

## 2017-06-15 ENCOUNTER — Encounter: Payer: Self-pay | Admitting: Physician Assistant

## 2017-08-25 DIAGNOSIS — H52223 Regular astigmatism, bilateral: Secondary | ICD-10-CM | POA: Diagnosis not present

## 2017-08-25 DIAGNOSIS — H5213 Myopia, bilateral: Secondary | ICD-10-CM | POA: Diagnosis not present

## 2017-11-18 MED FILL — MEDROXYPROGESTERONE 10 MG T: 10 | 10 days supply | Qty: 20 | Fill #0

## 2018-01-03 DIAGNOSIS — Z9884 Bariatric surgery status: Secondary | ICD-10-CM | POA: Diagnosis not present

## 2018-01-04 DIAGNOSIS — F349 Persistent mood [affective] disorder, unspecified: Secondary | ICD-10-CM | POA: Diagnosis not present

## 2018-01-04 DIAGNOSIS — C541 Malignant neoplasm of endometrium: Secondary | ICD-10-CM | POA: Diagnosis not present

## 2018-01-04 DIAGNOSIS — Z01419 Encounter for gynecological examination (general) (routine) without abnormal findings: Secondary | ICD-10-CM | POA: Diagnosis not present

## 2018-01-04 DIAGNOSIS — Z6841 Body Mass Index (BMI) 40.0 and over, adult: Secondary | ICD-10-CM | POA: Diagnosis not present

## 2018-01-06 DIAGNOSIS — C541 Malignant neoplasm of endometrium: Secondary | ICD-10-CM | POA: Diagnosis not present

## 2018-01-12 DIAGNOSIS — K912 Postsurgical malabsorption, not elsewhere classified: Secondary | ICD-10-CM | POA: Diagnosis not present

## 2018-02-06 MED FILL — MEDROXYPROGESTERONE 10 MG T: 10 | 10 days supply | Qty: 20 | Fill #0

## 2018-06-28 ENCOUNTER — Ambulatory Visit (INDEPENDENT_AMBULATORY_CARE_PROVIDER_SITE_OTHER): Payer: Self-pay | Admitting: Family Medicine

## 2018-09-15 DIAGNOSIS — H10413 Chronic giant papillary conjunctivitis, bilateral: Secondary | ICD-10-CM | POA: Diagnosis not present

## 2018-09-15 DIAGNOSIS — J300 Vasomotor rhinitis: Secondary | ICD-10-CM | POA: Diagnosis not present

## 2018-09-15 DIAGNOSIS — H16223 Keratoconjunctivitis sicca, not specified as Sjogren's, bilateral: Secondary | ICD-10-CM | POA: Diagnosis not present

## 2018-11-21 DIAGNOSIS — N915 Oligomenorrhea, unspecified: Secondary | ICD-10-CM | POA: Diagnosis not present

## 2018-11-21 DIAGNOSIS — Z3149 Encounter for other procreative investigation and testing: Secondary | ICD-10-CM | POA: Diagnosis not present

## 2018-11-28 DIAGNOSIS — N915 Oligomenorrhea, unspecified: Secondary | ICD-10-CM | POA: Diagnosis not present

## 2018-11-28 DIAGNOSIS — Z131 Encounter for screening for diabetes mellitus: Secondary | ICD-10-CM | POA: Diagnosis not present

## 2018-12-27 MED FILL — MEDROXYPROGESTERONE 10 MG T: 10 | 10 days supply | Qty: 20 | Fill #0

## 2019-01-10 MED FILL — CLOMIPHENE CITRATE 50 MG TA: 50 | 5 days supply | Qty: 5 | Fill #0

## 2019-01-24 DIAGNOSIS — Z3149 Encounter for other procreative investigation and testing: Secondary | ICD-10-CM | POA: Diagnosis not present

## 2019-02-13 MED FILL — CLOMIPHENE CITRATE 50 MG TA: 50 | 5 days supply | Qty: 10 | Fill #0

## 2019-02-21 MED FILL — OVIDREL 250 MCG/0.5 ML SYRG: 250 | 1 days supply | Qty: 1 | Fill #0

## 2019-02-23 DIAGNOSIS — Z3149 Encounter for other procreative investigation and testing: Secondary | ICD-10-CM | POA: Diagnosis not present

## 2019-03-19 MED FILL — CLOMIPHENE CITRATE 50 MG TA: 50 | 5 days supply | Qty: 15 | Fill #0

## 2019-03-27 MED FILL — OVIDREL 250 MCG/0.5 ML SYRG: 250 | 1 days supply | Qty: 1 | Fill #0

## 2019-03-29 DIAGNOSIS — Z3149 Encounter for other procreative investigation and testing: Secondary | ICD-10-CM | POA: Diagnosis not present

## 2019-03-29 MED FILL — metFORMIN HCL 500 MG TABS: 500 | 30 days supply | Qty: 90 | Fill #0

## 2019-03-29 MED FILL — MEDROXYPROGESTERONE 10 MG T: 10 | 10 days supply | Qty: 20 | Fill #0

## 2019-03-29 MED FILL — CLOMIPHENE CITRATE 50 MG TA: 50 | 5 days supply | Qty: 15 | Fill #0

## 2019-04-25 MED FILL — MEDROXYPROGESTERONE 10 MG T: 10 | 10 days supply | Qty: 20 | Fill #0

## 2019-05-11 MED FILL — CLOMIPHENE CITRATE 50 MG TA: 50 | 5 days supply | Qty: 15 | Fill #0

## 2019-05-14 MED FILL — metFORMIN HCL 500 MG TABS: 500 | 30 days supply | Qty: 90 | Fill #1

## 2019-05-22 ENCOUNTER — Encounter: Payer: Self-pay | Admitting: Physician Assistant

## 2019-05-22 DIAGNOSIS — Z3149 Encounter for other procreative investigation and testing: Secondary | ICD-10-CM | POA: Diagnosis not present

## 2019-05-25 DIAGNOSIS — Z3149 Encounter for other procreative investigation and testing: Secondary | ICD-10-CM | POA: Diagnosis not present

## 2019-05-28 ENCOUNTER — Encounter: Payer: Self-pay | Admitting: Physician Assistant

## 2019-05-28 DIAGNOSIS — Z0000 Encounter for general adult medical examination without abnormal findings: Secondary | ICD-10-CM

## 2019-05-28 DIAGNOSIS — E782 Mixed hyperlipidemia: Secondary | ICD-10-CM

## 2019-05-28 DIAGNOSIS — E559 Vitamin D deficiency, unspecified: Secondary | ICD-10-CM

## 2019-05-28 DIAGNOSIS — E538 Deficiency of other specified B group vitamins: Secondary | ICD-10-CM

## 2019-06-01 DIAGNOSIS — E782 Mixed hyperlipidemia: Secondary | ICD-10-CM | POA: Diagnosis not present

## 2019-06-01 DIAGNOSIS — E559 Vitamin D deficiency, unspecified: Secondary | ICD-10-CM | POA: Diagnosis not present

## 2019-06-01 DIAGNOSIS — E538 Deficiency of other specified B group vitamins: Secondary | ICD-10-CM | POA: Diagnosis not present

## 2019-06-01 DIAGNOSIS — Z0000 Encounter for general adult medical examination without abnormal findings: Secondary | ICD-10-CM | POA: Diagnosis not present

## 2019-06-02 LAB — CBC
HCT: 41.6 % (ref 35.0–45.0)
Hemoglobin: 13.8 g/dL (ref 11.7–15.5)
MCH: 27.9 pg (ref 27.0–33.0)
MCHC: 33.2 g/dL (ref 32.0–36.0)
MCV: 84.2 fL (ref 80.0–100.0)
MPV: 13.1 fL — ABNORMAL HIGH (ref 7.5–12.5)
Platelets: 172 10*3/uL (ref 140–400)
RBC: 4.94 10*6/uL (ref 3.80–5.10)
RDW: 12.3 % (ref 11.0–15.0)
WBC: 7.9 10*3/uL (ref 3.8–10.8)

## 2019-06-02 LAB — LIPID PANEL
Cholesterol: 192 mg/dL (ref <200)
HDL: 37 mg/dL — ABNORMAL LOW (ref >=50)
LDL Cholesterol (Calc): 128 mg/dL (calc) — ABNORMAL HIGH
Non-HDL Cholesterol (Calc): 155 mg/dL (calc) — ABNORMAL HIGH (ref <130)
Total CHOL/HDL Ratio: 5.2 (calc) — ABNORMAL HIGH (ref <5.0)
Triglycerides: 156 mg/dL — ABNORMAL HIGH (ref <150)

## 2019-06-02 LAB — COMPLETE METABOLIC PANEL WITH GFR
AG Ratio: 1.8 (calc) (ref 1.0–2.5)
ALT: 23 U/L (ref 6–29)
AST: 22 U/L (ref 10–30)
Albumin: 3.8 g/dL (ref 3.6–5.1)
Alkaline phosphatase (APISO): 68 U/L (ref 31–125)
BUN: 9 mg/dL (ref 7–25)
CO2: 26 mmol/L (ref 20–32)
Calcium: 8.8 mg/dL (ref 8.6–10.2)
Chloride: 105 mmol/L (ref 98–110)
Creat: 0.68 mg/dL (ref 0.50–1.10)
GFR, Est African American: 130 mL/min/{1.73_m2} (ref >=60)
GFR, Est Non African American: 113 mL/min/{1.73_m2} (ref >=60)
Globulin: 2.1 g/dL (calc) (ref 1.9–3.7)
Glucose, Bld: 95 mg/dL (ref 65–99)
Potassium: 4.2 mmol/L (ref 3.5–5.3)
Sodium: 139 mmol/L (ref 135–146)
Total Bilirubin: 0.5 mg/dL (ref 0.2–1.2)
Total Protein: 5.9 g/dL — ABNORMAL LOW (ref 6.1–8.1)

## 2019-06-02 LAB — VITAMIN B12: Vitamin B-12: 300 pg/mL (ref 200–1100)

## 2019-06-02 LAB — VITAMIN D 25 HYDROXY (VIT D DEFICIENCY, FRACTURES): Vit D, 25-Hydroxy: 20 ng/mL — ABNORMAL LOW (ref 30–100)

## 2019-06-07 NOTE — Progress Notes (Signed)
HPI: Valerie Aguilar is a 39 y.o. female who  has a past medical history of Abrasion of right eye, Arthritis, Cancer ([place]), Diabetes mellitus without complication ([place]), Hyperlipidemia, Hypertension, and Obesity, morbid ([place]).  she presents to [facility] today, 06/08/19,  for chief complaint of: Annual physical exam  Up-to-date on dental care and eye exams. Plans to start weight watchers for weight loss. Plan to increase exercise.  They are getting a new [place] shepherd puppy this week ([employer]).  Past medical, surgical, social and family history reviewed:  Patient Active Problem List   Diagnosis Date Noted  . B12 deficiency 11/29/2016  . Pernicious anemia 04/14/2016  . 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 Vitamin D deficiency 04/21/2015  . Right knee pain 09/25/2014  . Hyperlipidemia 06/04/2013  . Severe obesity (BMI >= 40) ([place]) 06/04/2013  . Pre-diabetes 05/18/2013    Past Surgical History:  Procedure Laterality Date  . DILATATION & CURETTAGE/HYSTEROSCOPY WITH MYOSURE N/A 07/07/2015   Procedure: DILATATION & CURETTAGE/HYSTEROSCOPY WITH MYOSURE;  Surgeon: Aloha Gell, MD;  Location: [facility];  Service: Gynecology;  Laterality: N/A;  . DILATION AND CURETTAGE OF UTERUS    . LAPAROSCOPIC GASTRIC SLEEVE RESECTION N/A 08/26/2015   Procedure: LAPAROSCOPIC GASTRIC SLEEVE RESECTION, UPPER ENDOSCOPY;  Surgeon: Excell Seltzer, MD;  Location: [facility];  Service: General;  Laterality: N/A;  . TONSILLECTOMY    . WISDOM TOOTH EXTRACTION      Social History   Tobacco Use  . Smoking status: Never Smoker  . Smokeless tobacco: Never Used  Substance Use Topics  . Alcohol use: No    Family History  Problem Relation Age of Onset  . COPD Mother   . Diabetes Mother   . Hypertension Father   . Diabetes Father   . Stroke Paternal Aunt   . Cancer Paternal  Uncle   . Diabetes Paternal Grandmother   . Hypertension Paternal Grandmother      Current medication list and allergy/intolerance information reviewed:    Current Outpatient Medications  Medication Sig Dispense Refill  . metFORMIN (GLUCOPHAGE) 500 MG tablet Take 1 tablet by mouth in the morning, at noon, and at bedtime.    . Multiple Vitamin tablet Take 1 tablet by mouth daily.     No current facility-administered medications for this visit.    Allergies  Allergen Reactions  . Lipitor [Atorvastatin]     Foggy headed.   . Lisinopril     Dry cough      Review of Systems:  Constitutional:  No  fever, no chills, No recent illness, No unintentional weight changes. No significant fatigue.   HEENT: No  headache, no vision change, no hearing change, No sore throat, No  sinus pressure  Cardiac: No  chest pain, No  pressure, No palpitations, No  Orthopnea  Respiratory:  No  shortness of breath. No  Cough  Gastrointestinal: No  abdominal pain, No  nausea, No  vomiting,  No  blood in stool, No  diarrhea, No  constipation   Musculoskeletal: No new myalgia/arthralgia  Skin: No  Rash, No other wounds/concerning lesions  Genitourinary: No  incontinence, No  abnormal genital bleeding, No abnormal genital discharge  Hem/Onc: No  easy bruising/bleeding, No  abnormal lymph node  Endocrine: No cold intolerance,  No heat intolerance. No polyuria/polydipsia/polyphagia   Neurologic: No  weakness, No  dizziness, No  slurred speech/focal weakness/facial droop  Psychiatric:  No  concerns with depression, No  concerns with anxiety, No sleep problems, No mood problems  Exam:  BP 118/73   Pulse 97   Temp 97.9 F (36.6 C) (Oral)   Ht 5\' 7"  (1.702 m)   Wt (!) 347 lb 6.4 oz (157.6 kg)   LMP 05/11/2019   SpO2 97%   BMI 54.41 kg/m   Constitutional: VS see above. General Appearance: alert, well-developed, well-nourished, NAD  Eyes: Normal lids and conjunctive, non-icteric sclera  Ears,  Nose, Mouth, Throat: MMM, Normal external inspection ears/nares/mouth/lips/gums. TM normal bilaterally. Pharynx/tonsils no erythema, no exudate. Nasal mucosa normal.   Neck: No masses, trachea midline. No thyroid enlargement. No tenderness/mass appreciated. No lymphadenopathy  Respiratory: Normal respiratory effort. no wheeze, no rhonchi, no rales  Cardiovascular: S1/S2 normal, no murmur, no rub/gallop auscultated. RRR. No lower extremity edema. Pedal pulse II/IV bilaterally DP and PT. No carotid bruit or JVD. No abdominal aortic bruit.  Gastrointestinal: Nontender, no masses. No hepatomegaly, no splenomegaly. No hernia appreciated. Bowel sounds normal. Rectal exam deferred.   Musculoskeletal: Gait normal. No clubbing/cyanosis of digits.   Neurological: Normal balance/coordination. No tremor. No cranial nerve deficit on limited exam. Motor and sensation intact and symmetric. Cerebellar reflexes intact.   Skin: warm, dry, intact. No rash/ulcer. No concerning nevi or subq nodules on limited exam.    Psychiatric: Normal judgment/insight. Normal mood and affect. Oriented x3.    No results found for this or any previous visit (from the past 72 hour(s)).  No results found.   ASSESSMENT/PLAN:   1. Annual physical exam Labs completed and reviewed.  Up-to-date on preventative care.   No orders of the defined types were placed in this encounter.   No orders of the defined types were placed in this encounter.   There are no Patient Instructions on file for this visit.  Follow-up plan: Return in about 1 year (around 06/07/2020) for annual physical exam.  Clearnce Sorrel, DNP, APRN, FNP-BC [facility]

## 2019-06-08 ENCOUNTER — Other Ambulatory Visit: Payer: Self-pay

## 2019-06-08 ENCOUNTER — Encounter: Payer: Self-pay | Admitting: Medical-Surgical

## 2019-06-08 ENCOUNTER — Ambulatory Visit (INDEPENDENT_AMBULATORY_CARE_PROVIDER_SITE_OTHER): Payer: 59 | Admitting: Medical-Surgical

## 2019-06-08 VITALS — BP 118/73 | HR 97 | Temp 97.9°F | Ht 67.0 in | Wt 347.4 lb

## 2019-06-08 DIAGNOSIS — Z0000 Encounter for general adult medical examination without abnormal findings: Secondary | ICD-10-CM | POA: Diagnosis not present

## 2019-06-08 NOTE — Patient Instructions (Signed)
Preventive Care 21-37 Years Old, Female Preventive care refers to visits with your health care provider and lifestyle choices that can promote health and wellness. This includes:  A yearly physical exam. This may also be called an annual well check.  Regular dental visits and eye exams.  Immunizations.  Screening for certain conditions.  Healthy lifestyle choices, such as eating a healthy diet, getting regular exercise, not using drugs or products that contain nicotine and tobacco, and limiting alcohol use. What can I expect for my preventive care visit? Physical exam Your health care provider will check your:  Height and weight. This may be used to calculate body mass index (BMI), which tells if you are at a healthy weight.  Heart rate and blood pressure.  Skin for abnormal spots. Counseling Your health care provider may ask you questions about your:  Alcohol, tobacco, and drug use.  Emotional well-being.  Home and relationship well-being.  Sexual activity.  Eating habits.  Work and work environment.  Method of birth control.  Menstrual cycle.  Pregnancy history. What immunizations do I need?  Influenza (flu) vaccine  This is recommended every year. Tetanus, diphtheria, and pertussis (Tdap) vaccine  You may need a Td booster every 10 years. Varicella (chickenpox) vaccine  You may need this if you have not been vaccinated. Human papillomavirus (HPV) vaccine  If recommended by your health care provider, you may need three doses over 6 months. Measles, mumps, and rubella (MMR) vaccine  You may need at least one dose of MMR. You may also need a second dose. Meningococcal conjugate (MenACWY) vaccine  One dose is recommended if you are age 19-21 years and a first-year college student living in a residence hall, or if you have one of several medical conditions. You may also need additional booster doses. Pneumococcal conjugate (PCV13) vaccine  You may need  this if you have certain conditions and were not previously vaccinated. Pneumococcal polysaccharide (PPSV23) vaccine  You may need one or two doses if you smoke cigarettes or if you have certain conditions. Hepatitis A vaccine  You may need this if you have certain conditions or if you travel or work in places where you may be exposed to hepatitis A. Hepatitis B vaccine  You may need this if you have certain conditions or if you travel or work in places where you may be exposed to hepatitis B. Haemophilus influenzae type b (Hib) vaccine  You may need this if you have certain conditions. You may receive vaccines as individual doses or as more than one vaccine together in one shot (combination vaccines). Talk with your health care provider about the risks and benefits of combination vaccines. What tests do I need?  Blood tests  Lipid and cholesterol levels. These may be checked every 5 years starting at age 20.  Hepatitis C test.  Hepatitis B test. Screening  Diabetes screening. This is done by checking your blood sugar (glucose) after you have not eaten for a while (fasting).  Sexually transmitted disease (STD) testing.  BRCA-related cancer screening. This may be done if you have a family history of breast, ovarian, tubal, or peritoneal cancers.  Pelvic exam and Pap test. This may be done every 3 years starting at age 21. Starting at age 30, this may be done every 5 years if you have a Pap test in combination with an HPV test. Talk with your health care provider about your test results, treatment options, and if necessary, the need for more tests.   Follow these instructions at home: Eating and drinking   Eat a diet that includes fresh fruits and vegetables, whole grains, lean protein, and low-fat dairy.  Take vitamin and mineral supplements as recommended by your health care provider.  Do not drink alcohol if: ? Your health care provider tells you not to drink. ? You are  pregnant, may be pregnant, or are planning to become pregnant.  If you drink alcohol: ? Limit how much you have to 0-1 drink a day. ? Be aware of how much alcohol is in your drink. In the [place] one drink equals one 12 oz bottle of beer (355 mL), one 5 oz glass of wine (148 mL), or one 1 oz glass of hard liquor (44 mL). Lifestyle  Take daily care of your teeth and gums.  Stay active. Exercise for at least 30 minutes on 5 or more days each week.  Do not use any products that contain nicotine or tobacco, such as cigarettes, e-cigarettes, and chewing tobacco. If you need help quitting, ask your health care provider.  If you are sexually active, practice safe sex. Use a condom or other form of birth control (contraception) in order to prevent pregnancy and STIs (sexually transmitted infections). If you plan to become pregnant, see your health care provider for a preconception visit. What's next?  Visit your health care provider once a year for a well check visit.  Ask your health care provider how often you should have your eyes and teeth checked.  Stay up to date on all vaccines. This information is not intended to replace advice given to you by your health care provider. Make sure you discuss any questions you have with your health care provider. Document Revised: 09/15/2017 Document Reviewed: 09/15/2017 Elsevier Patient Education  2020 [employer].

## 2019-06-19 MED FILL — MEDROXYPROGESTERONE 10 MG T: 10 | 10 days supply | Qty: 20 | Fill #0

## 2019-06-19 MED FILL — CLOMIPHENE CITRATE 50 MG TA: 50 | 5 days supply | Qty: 20 | Fill #0

## 2019-07-24 MED FILL — METFORMIN HCL 500 MG TABS: 500 | 30 days supply | Qty: 90 | Fill #2

## 2019-07-31 DIAGNOSIS — B372 Candidiasis of skin and nail: Secondary | ICD-10-CM | POA: Diagnosis not present

## 2019-07-31 DIAGNOSIS — Z6841 Body Mass Index (BMI) 40.0 and over, adult: Secondary | ICD-10-CM | POA: Diagnosis not present

## 2019-07-31 DIAGNOSIS — Z01419 Encounter for gynecological examination (general) (routine) without abnormal findings: Secondary | ICD-10-CM | POA: Diagnosis not present

## 2019-07-31 DIAGNOSIS — Z3149 Encounter for other procreative investigation and testing: Secondary | ICD-10-CM | POA: Diagnosis not present

## 2019-07-31 MED FILL — NYSTATIN-TRIAMCINOLONE OINT: 100000-0.1 | 15 days supply | Qty: 30 | Fill #0

## 2019-08-23 MED FILL — MEDROXYPROGESTERONE 10 MG T: 10 | 10 days supply | Qty: 20 | Fill #0

## 2019-08-24 MED FILL — NYSTATIN-TRIAMCINOLONE OINT: 100000-0.1 | 15 days supply | Qty: 30 | Fill #0

## 2019-09-20 DIAGNOSIS — N970 Female infertility associated with anovulation: Secondary | ICD-10-CM | POA: Diagnosis not present

## 2019-09-20 DIAGNOSIS — E2839 Other primary ovarian failure: Secondary | ICD-10-CM | POA: Diagnosis not present

## 2019-09-20 DIAGNOSIS — Z319 Encounter for procreative management, unspecified: Secondary | ICD-10-CM | POA: Diagnosis not present

## 2019-10-22 DIAGNOSIS — E2839 Other primary ovarian failure: Secondary | ICD-10-CM | POA: Diagnosis not present

## 2019-10-22 DIAGNOSIS — Z319 Encounter for procreative management, unspecified: Secondary | ICD-10-CM | POA: Diagnosis not present

## 2019-10-30 ENCOUNTER — Encounter: Payer: Self-pay | Admitting: Physician Assistant

## 2019-11-05 ENCOUNTER — Telehealth: Payer: Self-pay | Admitting: Physician Assistant

## 2019-11-05 NOTE — Telephone Encounter (Signed)
Pt dropped off Bariatric surgery paperwork to be filled out. Left in JJ's box, informed of 3-5 day turn around.

## 2019-11-06 NOTE — Telephone Encounter (Signed)
Can you just print what we filled out before. Is she having another surgery?

## 2019-11-06 NOTE — Telephone Encounter (Signed)
Patient saw Valerie Aguilar in May for annual exam, has not seen Valerie Aguilar since October 2019. Looks like she has had gastric sleeve in 2017.   Tasha Jindra - Do you need appt to discuss with patient?

## 2019-11-06 NOTE — Telephone Encounter (Signed)
No past forms in chart. Will continue documentation in separate open note from [employer].

## 2019-11-06 NOTE — Telephone Encounter (Signed)
Have you seen this paperwork?

## 2019-12-19 ENCOUNTER — Other Ambulatory Visit: Payer: Self-pay | Admitting: General Surgery

## 2019-12-19 ENCOUNTER — Other Ambulatory Visit (HOSPITAL_COMMUNITY): Payer: Self-pay | Admitting: General Surgery

## 2019-12-19 DIAGNOSIS — Z9884 Bariatric surgery status: Secondary | ICD-10-CM

## 2019-12-19 DIAGNOSIS — R7303 Prediabetes: Secondary | ICD-10-CM | POA: Diagnosis not present

## 2019-12-24 ENCOUNTER — Encounter: Payer: Self-pay | Admitting: Physician Assistant

## 2019-12-24 NOTE — Telephone Encounter (Signed)
Can we call this patient and get her scheduled?

## 2019-12-26 NOTE — Telephone Encounter (Signed)
LVM to get this appt scheduled. AM

## 2020-01-03 ENCOUNTER — Other Ambulatory Visit (HOSPITAL_COMMUNITY): Payer: 59

## 2020-01-03 ENCOUNTER — Ambulatory Visit (HOSPITAL_COMMUNITY): Payer: 59

## 2020-01-22 ENCOUNTER — Other Ambulatory Visit (HOSPITAL_COMMUNITY): Payer: Self-pay | Admitting: General Surgery

## 2020-01-22 ENCOUNTER — Other Ambulatory Visit: Payer: Self-pay

## 2020-01-22 ENCOUNTER — Ambulatory Visit (HOSPITAL_COMMUNITY)
Admission: RE | Admit: 2020-01-22 | Discharge: 2020-01-22 | Disposition: A | Discharge status: Home / Self Care | Payer: 59 | Source: Ambulatory Visit | Attending: General Surgery | Admitting: General Surgery

## 2020-01-22 DIAGNOSIS — Z9884 Bariatric surgery status: Secondary | ICD-10-CM | POA: Insufficient documentation

## 2020-01-22 DIAGNOSIS — K219 Gastro-esophageal reflux disease without esophagitis: Secondary | ICD-10-CM | POA: Diagnosis not present

## 2020-01-22 DIAGNOSIS — Z01818 Encounter for other preprocedural examination: Secondary | ICD-10-CM | POA: Diagnosis not present

## 2020-01-30 ENCOUNTER — Encounter: Payer: 59 | Attending: General Surgery | Admitting: Skilled Nursing Facility1

## 2020-01-30 ENCOUNTER — Other Ambulatory Visit: Payer: Self-pay

## 2020-01-30 ENCOUNTER — Encounter: Payer: Self-pay | Admitting: Physician Assistant

## 2020-01-30 ENCOUNTER — Other Ambulatory Visit (HOSPITAL_COMMUNITY): Payer: Self-pay | Admitting: Physician Assistant

## 2020-01-30 DIAGNOSIS — Z6841 Body Mass Index (BMI) 40.0 and over, adult: Secondary | ICD-10-CM | POA: Diagnosis not present

## 2020-01-30 MED ORDER — ERGOCALCIFEROL 1.25 MG (50000 UT) PO CAPS
50000.0000 [IU] | ORAL_CAPSULE | ORAL | 1 refills | Status: DC
Start: 2020-01-30 — End: 2020-05-20

## 2020-01-30 MED FILL — VIT D2 1.25 MG (50,000 UNIT: 1.25 MG | 84 days supply | Qty: 12 | Fill #0

## 2020-01-30 NOTE — Progress Notes (Signed)
Nutrition Assessment for Bariatric Surgery Medical Nutrition Therapy Appt Start Time: 9:00 End Time: 10:00  Patient was seen on 01/30/2020 for Pre-Operative Nutrition Assessment. Letter of approval faxed to [facility] bariatric surgery program coordinator on 01/30/2020  Referral stated Supervised Weight Loss (SWL) visits needed: 0; pt will return for a minimum of one visit to help move her from pre contemplative to action stage of change  Planned surgery: Sleeve gastrectomy to RYGB Pt expectation of surgery: to lose weight Pt expectation of dietitian: none stated    NUTRITION ASSESSMENT   Anthropometrics  Start weight at [facility]: 359.4 lbs (date: 01/30/2020)  Height: 67 in BMI: 56.35 kg/m2     Clinical  Medical hx: prediabetes Medications: none Labs: b12 300, vitamin D 20, HDL 37, Triglycerides 156 Notable signs/symptoms: back pain Any previous deficiencies? YES; vitamin B12, vitamin D,   Micronutrient Nutrition Focused Physical Exam: Hair: No issues observed Eyes: No issues observed Mouth: No issues observed Neck: No issues observed Nails: No issues observed Skin: No issues observed  Lifestyle & Dietary Hx  Pt states she gained her weight due to the pandemic. Pt states her father in law makes her meals. Pt states she is particular about her plain water and does not like non starchy vegetables.   24-Hr Dietary Recall First Meal: skipped or atkins protein bar Snack:  Second Meal: 2 egg and 2 bacon Snack: beef stick Third Meal: grilled chicken wrap + tater tots  Snack: sometimes cheese Beverages: water, unsweet tea + stevia, soda, sugar free natures twist   Estimated Energy Needs Calories: 1600   NUTRITION DIAGNOSIS  Overweight/obesity ([place]-3.3) related to past poor dietary habits and physical inactivity as evidenced by patient w/ planned conversion of sleeve to RYGB surgery following dietary guidelines for continued weight loss.    NUTRITION  INTERVENTION  Nutrition counseling (C-1) and education (E-2) to facilitate bariatric surgery goals.   Pre-Op Goals Reviewed with the Patient . Track food and beverage intake (pen and paper, MyFitness Pal, Baritastic app, etc.) . Make healthy food choices while monitoring portion sizes . Consume 3 meals per day or try to eat every 3-5 hours . Avoid concentrated sugars and fried foods . Keep sugar & fat in the single digits per serving on food labels . Practice CHEWING your food (aim for applesauce consistency) . Practice not drinking 15 minutes before, during, and 30 minutes after each meal and snack . Avoid all carbonated beverages (ex: soda, sparkling beverages)  . Limit caffeinated beverages (ex: coffee, tea, energy drinks) . Avoid all sugar-sweetened beverages (ex: regular soda, sports drinks)  . Avoid alcohol  . Aim for 64-100 ounces of FLUID daily (with at least half of fluid intake being plain water)  . Aim for at least 60-80 grams of PROTEIN daily . Look for a liquid protein source that contains ?15 g protein and ?5 g carbohydrate (ex: shakes, drinks, shots) . Make a list of non-food related activities . Physical activity is an important part of a healthy lifestyle so keep it moving! The goal is to reach 150 minutes of exercise per week, including cardiovascular and weight baring activity. . Get the appropriate multivitamin and calcium  . Take the vitamin D supplement  . Try new non starchy vegetables cooked different ways  *Goals that are bolded indicate the pt would like to start working towards these  Handouts Provided Include  . Bariatric Surgery handouts (Nutrition Visits, Pre-Op Goals, Protein Shakes, Vitamins & Minerals)  Learning Style &  Readiness for Change Teaching method utilized: Visual & Auditory  Demonstrated degree of understanding via: Teach Back  Readiness Level: precontemplative Barriers to learning/adherence to lifestyle change: unknown  RD's Notes for  Next Visit . Assess pts adherence to chosen goals     MONITORING & EVALUATION Dietary intake, weekly physical activity, body weight, and pre-op goals reached at next nutrition visit.    Next Steps  Patient is to follow up at [facility] for Pre-Op Class >2 weeks before surgery for further nutrition education.

## 2020-02-02 DIAGNOSIS — Z20828 Contact with and (suspected) exposure to other viral communicable diseases: Secondary | ICD-10-CM | POA: Diagnosis not present

## 2020-02-27 ENCOUNTER — Other Ambulatory Visit: Payer: Self-pay

## 2020-02-27 ENCOUNTER — Encounter: Payer: 59 | Attending: General Surgery | Admitting: Skilled Nursing Facility1

## 2020-02-27 NOTE — Progress Notes (Signed)
Supervised Weight Loss Visit Bariatric Nutrition Education  Pt has completed her visits.  NUTRITION ASSESSMENT  Anthropometrics  Start weight at [facility]: 359.4 lbs (date: 01/30/2020)  Weight: 361 pounds BMI: 56.35 kg/m2     Clinical  Medical hx: prediabetes Medications: none Labs: b12 300, vitamin D 20, HDL 37, Triglycerides 156 Notable signs/symptoms: back pain Any previous deficiencies? YES; vitamin B12, vitamin D Lifestyle & Dietary Hx  Pt states she is taking a vitamin D supplement now.  Pt states from logging what she eats she learned she eats better at lunch than dinner stating that is because her father in law cooks for her. Pt state her father in law is willing try some new recipes. Pt states she has tried cooked carrots which she liked. Pt states he tried broccoli and cauliflower did not like them bit will try them again. Pt states she has tried some water without flavoring.  Pt states she may struggle with eating a balanced meals being based on whether she lives with her father in law or not.   Pt states she feels she will be successful with increasing her workouts after surgery.   Estimated daily fluid intake:  oz Supplements: multi and calcium  Current average weekly physical activity: ADL's  24-Hr Dietary Recall First Meal: atkins protein bar Snack:  Second Meal: [place] and cheese on rye with fruit Snack: beef stick Third Meal: potato + chicken + shrimp  Snack: 2 girlscout cookies Beverages: water, unsweet tea + alternative sugar, sugar free kool aide, sugar free natures twist, fairlife milk  Estimated Energy Needs Calories: 1200   NUTRITION DIAGNOSIS  Overweight/obesity ([place]-3.3) related to past poor dietary habits and physical inactivity as evidenced by patient w/ planned sleeve to RYGB surgery following dietary guidelines for continued weight loss.   NUTRITION INTERVENTION  Nutrition counseling (C-1) and education (E-2) to facilitate bariatric surgery  goals.  Pre-Op Goals Progress & New Goals . Continue: Log what you eat and drink  Learning Style & Readiness for Change Teaching method utilized: Visual & Auditory  Demonstrated degree of understanding via: Teach Back  Readiness Level: action . Barriers to learning/adherence to lifestyle change: none identified    MONITORING & EVALUATION Dietary intake, weekly physical activity, body weight, and pre-op goals in 1 month.   Next Steps  Patient is to return to [facility]

## 2020-04-14 ENCOUNTER — Encounter: Payer: Self-pay | Admitting: Physician Assistant

## 2020-04-14 ENCOUNTER — Other Ambulatory Visit: Payer: Self-pay

## 2020-04-14 ENCOUNTER — Ambulatory Visit: Payer: 59 | Admitting: Physician Assistant

## 2020-04-14 ENCOUNTER — Ambulatory Visit (INDEPENDENT_AMBULATORY_CARE_PROVIDER_SITE_OTHER): Payer: 59

## 2020-04-14 ENCOUNTER — Other Ambulatory Visit: Payer: Self-pay | Admitting: Physician Assistant

## 2020-04-14 VITALS — BP 144/84 | HR 85 | Ht 67.0 in | Wt 368.0 lb

## 2020-04-14 DIAGNOSIS — R7301 Impaired fasting glucose: Secondary | ICD-10-CM

## 2020-04-14 DIAGNOSIS — Z23 Encounter for immunization: Secondary | ICD-10-CM

## 2020-04-14 DIAGNOSIS — G8929 Other chronic pain: Secondary | ICD-10-CM

## 2020-04-14 DIAGNOSIS — M1712 Unilateral primary osteoarthritis, left knee: Secondary | ICD-10-CM | POA: Diagnosis not present

## 2020-04-14 DIAGNOSIS — Z903 Acquired absence of stomach [part of]: Secondary | ICD-10-CM | POA: Diagnosis not present

## 2020-04-14 DIAGNOSIS — M25562 Pain in left knee: Secondary | ICD-10-CM | POA: Diagnosis not present

## 2020-04-14 DIAGNOSIS — M19012 Primary osteoarthritis, left shoulder: Secondary | ICD-10-CM | POA: Diagnosis not present

## 2020-04-14 DIAGNOSIS — M25512 Pain in left shoulder: Secondary | ICD-10-CM

## 2020-04-14 MED ORDER — WEGOVY 0.25 MG/0.5ML ~~LOC~~ SOAJ: 0.2500 mg | SUBCUTANEOUS | 0 refills | Status: DC

## 2020-04-14 NOTE — Patient Instructions (Signed)
Shoulder Impingement Syndrome Rehab Ask your health care provider which exercises are safe for you. Do exercises exactly as told by your health care provider and adjust them as directed. It is normal to feel mild stretching, pulling, tightness, or discomfort as you do these exercises. Stop right away if you feel sudden pain or your pain gets worse. Do not begin these exercises until told by your health care provider. Stretching and range-of-motion exercise This exercise warms up your muscles and joints and improves the movement and flexibility of your shoulder. This exercise also helps to relieve pain and stiffness. Passive horizontal adduction In passive adduction, you use your other hand to move the injured arm toward your body. The injured arm does not move on its own. In this movement, your arm is moved across your body in the horizontal plane (horizontal adduction). 1. Sit or stand and pull your left / right elbow across your chest, toward your other shoulder. Stop when you feel a gentle stretch in the back of your shoulder and upper arm. ? Keep your arm at shoulder height. ? Keep your arm as close to your body as you comfortably can. 2. Hold for __________ seconds. 3. Slowly return to the starting position. Repeat __________ times. Complete this exercise __________ times a day.   Strengthening exercises These exercises build strength and endurance in your shoulder. Endurance is the ability to use your muscles for a long time, even after they get tired. External rotation, isometric This is an exercise in which you press the back of your wrist against a door frame without moving your shoulder joint (isometric). 1. Stand or sit in a doorway, facing the door frame. 2. Bend your left / right elbow and place the back of your wrist against the door frame. Only the back of your wrist should be touching the frame. Keep your upper arm at your side. 3. Gently press your wrist against the door frame, as  if you are trying to push your arm away from your abdomen (external rotation). Press as hard as you are able without pain. ? Avoid shrugging your shoulder while you press your wrist against the door frame. Keep your shoulder blade tucked down toward the middle of your back. 4. Hold for __________ seconds. 5. Slowly release the tension, and relax your muscles completely before you repeat the exercise. Repeat __________ times. Complete this exercise __________ times a day. Internal rotation, isometric This is an exercise in which you press your palm against a door frame without moving your shoulder joint (isometric). 1. Stand or sit in a doorway, facing the door frame. 2. Bend your left / right elbow and place the palm of your hand against the door frame. Only your palm should be touching the frame. Keep your upper arm at your side. 3. Gently press your hand against the door frame, as if you are trying to push your arm toward your abdomen (internal rotation). Press as hard as you are able without pain. ? Avoid shrugging your shoulder while you press your hand against the door frame. Keep your shoulder blade tucked down toward the middle of your back. 4. Hold for __________ seconds. 5. Slowly release the tension, and relax your muscles completely before you repeat the exercise. Repeat __________ times. Complete this exercise __________ times a day.   Scapular protraction, supine 1. Lie on your back on a firm surface (supine position). Hold a __________ weight in your left / right hand. 2. Raise your left /  right arm straight into the air so your hand is directly above your shoulder joint. 3. Push the weight into the air so your shoulder (scapula) lifts off the surface that you are lying on. The scapula will push up or forward (protraction). Do not move your head, neck, or back. 4. Hold for __________ seconds. 5. Slowly return to the starting position. Let your muscles relax completely before you  repeat this exercise. Repeat __________ times. Complete this exercise __________ times a day.   Scapular retraction 1. Sit in a stable chair without armrests, or stand up. 2. Secure an exercise band to a stable object in front of you so the band is at shoulder height. 3. Hold one end of the exercise band in each hand. Your palms should face down. 4. Squeeze your shoulder blades together (retraction) and move your elbows slightly behind you. Do not shrug your shoulders upward while you do this. 5. Hold for __________ seconds. 6. Slowly return to the starting position. Repeat __________ times. Complete this exercise __________ times a day.   Shoulder extension 1. Sit in a stable chair without armrests, or stand up. 2. Secure an exercise band to a stable object in front of you so the band is above shoulder height. 3. Hold one end of the exercise band in each hand. 4. Straighten your elbows and lift your hands up to shoulder height. 5. Squeeze your shoulder blades together and pull your hands down to the sides of your thighs (extension). Stop when your hands are straight down by your sides. Do not let your hands go behind your body. 6. Hold for __________ seconds. 7. Slowly return to the starting position. Repeat __________ times. Complete this exercise __________ times a day.   This information is not intended to replace advice given to you by your health care provider. Make sure you discuss any questions you have with your health care provider. Document Revised: 04/28/2018 Document Reviewed: 01/30/2018 Elsevier Patient Education  2021 [employer]. Patellofemoral Pain Syndrome  Patellofemoral pain syndrome is a condition in which the tissue (cartilage) on the underside of the kneecap (patella) softens or breaks down. This causes pain in the front of the knee. The condition is also called runner's knee or chondromalacia patella. Patellofemoral pain syndrome is most common in young adults who  are active in sports. The knee is the largest joint in the body. The patella covers the front of the knee and is attached to muscles above and below the knee. The underside of the patella is covered with a smooth type of cartilage (synovium). The smooth surface helps the patella glide easily when you move your knee. Patellofemoral pain syndrome causes swelling in the joint linings and bone surfaces in the knee. What are the causes? This condition may be caused by:  Overuse of the knee.  Poor alignment of your knee joints.  Weak leg muscles.  A direct hit to your kneecap. What increases the risk? You are more likely to develop this condition if:  You do a lot of activities that can wear down your kneecap. These include: ? Running. ? Squatting. ? Climbing stairs.  You start a new physical activity or exercise program.  You wear shoes that do not fit well.  You do not have good leg strength.  You are overweight. What are the signs or symptoms? The main symptom of this condition is knee pain. This may feel like a dull, aching pain underneath your patella, in the front of  your knee. There may be a popping or cracking sound when you move your knee. Pain may get worse with:  Exercise.  Climbing stairs.  Running.  Jumping.  Squatting.  Kneeling.  Sitting for a long time.  Moving or pushing on your patella. How is this diagnosed? This condition may be diagnosed based on:  Your symptoms and medical history. You may be asked about your recent physical activities and which ones cause knee pain.  A physical exam. This may include: ? Moving your patella back and forth. ? Checking your range of knee motion. ? Having you squat or jump to see if you have pain. ? Checking the strength of your leg muscles.  Imaging tests to confirm the diagnosis. These may include an MRI of your knee. How is this treated? This condition may be treated at home with rest, ice, compression, and  elevation (RICE).  Other treatments may include:  NSAIDs, such as ibuprofen.  Physical therapy to stretch and strengthen your leg muscles.  Shoe inserts (orthotics) to take stress off your knee.  A knee brace or knee support.  Adhesive tapes to the skin.  Surgery to remove damaged cartilage or move the patella to a better position. This is rare. Follow these instructions at home: If you have a brace:  Wear the brace as told by your health care provider. Remove it only as told by your health care provider.  Loosen the brace if your toes tingle, become numb, or turn cold and blue.  Keep the brace clean.  If the brace is not waterproof: ? Do not let it get wet. ? Cover it with a watertight covering when you take a bath or a shower. Managing pain, stiffness, and swelling  If directed, put ice on the painful area. To do this: ? If you have a removable brace, remove it as told by your health care provider. ? Put ice in a plastic bag. ? Place a towel between your skin and the bag. ? Leave the ice on for 20 minutes, 2-3 times a day. ? Remove the ice if your skin turns bright red. This is very important. If you cannot feel pain, heat, or cold, you have a greater risk of damage to the area.  Move your toes often to reduce stiffness and swelling.  Raise (elevate) the injured area above the level of your heart while you are sitting or lying down.   Activity  Rest your knee.  Avoid activities that cause knee pain.  Perform stretching and strengthening exercises as told by your health care provider or physical therapist.  Return to your normal activities as told by your health care provider. Ask your health care provider what activities are safe for you. General instructions  Take over-the-counter and prescription medicines only as told by your health care provider.  Use splints, braces, knee supports, or walking aids as directed by your health care provider.  Do not use any  products that contain nicotine or tobacco, such as cigarettes, e-cigarettes, and chewing tobacco. These can delay healing. If you need help quitting, ask your health care provider.  Keep all follow-up visits. This is important. Contact a health care provider if:  Your symptoms get worse.  You are not improving with home care. Summary  Patellofemoral pain syndrome is a condition in which the tissue (cartilage) on the underside of the kneecap (patella) softens or breaks down.  This condition causes swelling in the joint linings and bone surfaces in the  knee. This leads to pain in the front of the knee.  This condition may be treated at home with rest, ice, compression, and elevation (RICE).  Use splints, braces, knee supports, or walking aids as directed by your health care provider. This information is not intended to replace advice given to you by your health care provider. Make sure you discuss any questions you have with your health care provider. Document Revised: 06/20/2019 Document Reviewed: 06/20/2019 Elsevier Patient Education  2021 [employer].

## 2020-04-14 NOTE — Progress Notes (Signed)
Subjective:    Patient ID: Valerie Aguilar, female    DOB: 11/12/1980, 40 y.o.   MRN: [id number]  HPI  Patient is a 40 year old obese female with prediabetes, hyperlipidemia, pernicious anemia who presents to the clinic for follow-up and to address ongoing joint pain.   Patient had gastric sleeve surgery done back in 2017.  She has not lost the desired weight.  She is considering revision with Bowman surgery.  She is interested in trying medication and referral to medical weight loss before then.  She is fairly active at her job but does not do any other exercise.  She has heard about will go been interested in trying.  She does not remember trying any other GLP-1 medication.  She has prediabetes but does not have diabetes.  Patient fell last week going down the steps.  She fell mostly flat but hit her left knee on the steps.  Her left knee has been painful and swollen since.  She is using Voltaren gel and the relief is temporary.  She continues to walk but hard to stand up, bend, flex.  She avoids anti-inflammatories due to her gastric sleeve surgery.  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  She has also had left shoulder pain for years.  She remembers an incident where she caught herself with her left arm and it has been painful intermittently ever since.  She does not know of a time where she had x-rays.  She feels like over the past 6 months her left shoulder pain and range of motion have decreased.  She admits to using it more than usual since they are moving.  Occasionally her middle ring and pinky finger will go numb on the left hand.    .. Active Ambulatory Problems    Diagnosis Date Noted  . Pre-diabetes 05/18/2013  . Hyperlipidemia 06/04/2013  . Severe obesity (BMI >= 40) ([place]) 06/04/2013  . Right knee pain 09/25/2014  . Vitamin D deficiency 04/21/2015  . 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 Pernicious anemia 04/14/2016  . 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 B12 deficiency 11/29/2016  . Chronic pain of left knee 04/14/2020  . Chronic left shoulder pain 04/14/2020   Resolved Ambulatory Problems    Diagnosis Date Noted  . Essential hypertension, benign 05/11/2013  . Morbid obesity with body mass index (BMI) of 60.0 to 69.9 in adult [facility]) 08/26/2015   Past Medical History:  Diagnosis Date  . Abrasion of right eye   . Arthritis   . Cancer ([place])   . Diabetes mellitus without complication ([place])   . Hypertension   . Obesity, morbid ([place])       Review of Systems See HPI.     Objective:   Physical Exam Vitals reviewed.  Constitutional:      Appearance: Normal appearance. She is obese.  Cardiovascular:     Rate and Rhythm: Normal rate and regular rhythm.     Pulses: Normal pulses.     Heart sounds: Normal heart sounds.  Pulmonary:     Effort: Pulmonary effort is normal.     Breath sounds: Normal breath sounds.  Musculoskeletal:     Right lower leg: No edema.     Left lower leg: No edema.     Comments: Left knee:  Tenderness to palpation over kneecap and just above at insertion of patellofemoral tendon.  Strength  5/5 of lower ext.  Pain with extension and flexion.  No effusion.  No joint tenderness.  No bruising or redness.   Left shoulder: Decreased ROM due to pain with abduction 90 degrees active and 140 degrees passive.  Tenderness over anterior shoulder in bicipital groove.  Pain and strength decrease with bicep resistance.  Pain with external and internal rotation.  Upper ext strength 5/5.   Neurological:     General: No focal deficit present.     Mental Status: She is alert and oriented to person, place, and time.  Psychiatric:        Mood and Affect: Mood normal.           Assessment & Plan:  .Marland KitchenSophya was seen today for pain.  Diagnoses and all orders for this visit:  Chronic left  shoulder pain -     DG Shoulder Left  Need for Tdap vaccination -     Tdap vaccine greater than or equal to 7yo IM  Chronic pain of left knee -     DG Knee Complete 4 Views Left; Future  Severe obesity (BMI >= 40) (HCC) -     Lipid Panel w/reflex Direct LDL -     COMPLETE METABOLIC PANEL WITH GFR -     CBC with Differential/Platelet -     Semaglutide-Weight Management (WEGOVY) 0.25 MG/0.5ML SOAJ; Inject 0.25 mg into the skin once a week. -     Amb Ref to Medical Weight Management  History of sleeve gastrectomy -     B12 -     VITAMIN D 25 Hydroxy (Vit-D Deficiency, Fractures) -     Amb Ref to Medical Weight Management   She is having trouble getting her gastric sleeve revision approved.  We will make referral to medical weight loss.  We will try to get would go being 2.5 mg approved.  Patient is aware of the titration up to 2.4 mg.  Discussed side effects with patient.  Patient is aware this is a once weekly shot.  Encouraged regular exercise with at least 150 minutes a week.  Continue to watch portion sizes and food choices.  Patient does have a history of chondromalacia behind patella which is chronic pain but after fall pain more acute.  Likely this is some exacerbation of that and perhaps some patellofemoral strain.  Continue Voltaren gel.  Encouraged ibuprofen for the next 2 to 3 days with food.  If any GI symptoms may stop.  Given exercises to strengthen knee to start. She cannot afford formal PT copay right now.  X-rays ordered for knee.  Follow-up with sports medicine if not improving in the next 2 weeks for injection.  X-rays ordered to evaluate shoulder pain.  Seems like some rotator cuff tendinitis in combination with bicipital tendonitis. She still has strength in her left shoulder to suggest no complete tear.  Continue to use Voltaren and start strengthening exercises.  Follow-up in 2 weeks with sports medicine to consider injections if not improving. Avoid ongoing NsAIDs but  for a few days should be fine.   Screening labs ordered.  tdap given.

## 2020-04-14 NOTE — Progress Notes (Signed)
Arthritis but no other significant changes.

## 2020-04-14 NOTE — Progress Notes (Signed)
AC joint arthritic changes.

## 2020-04-15 ENCOUNTER — Encounter: Payer: Self-pay | Admitting: Physician Assistant

## 2020-04-15 DIAGNOSIS — R7301 Impaired fasting glucose: Secondary | ICD-10-CM | POA: Insufficient documentation

## 2020-04-15 LAB — LIPID PANEL W/REFLEX DIRECT LDL
Cholesterol: 231 mg/dL — ABNORMAL HIGH (ref <200)
HDL: 38 mg/dL — ABNORMAL LOW (ref >=50)
LDL Cholesterol (Calc): 163 mg/dL (calc) — ABNORMAL HIGH
Non-HDL Cholesterol (Calc): 193 mg/dL (calc) — ABNORMAL HIGH (ref <130)
Total CHOL/HDL Ratio: 6.1 (calc) — ABNORMAL HIGH (ref <5.0)
Triglycerides: 151 mg/dL — ABNORMAL HIGH (ref <150)

## 2020-04-15 LAB — COMPLETE METABOLIC PANEL WITH GFR
AG Ratio: 1.4 (calc) (ref 1.0–2.5)
ALT: 27 U/L (ref 6–29)
AST: 24 U/L (ref 10–30)
Albumin: 3.7 g/dL (ref 3.6–5.1)
Alkaline phosphatase (APISO): 95 U/L (ref 31–125)
BUN: 9 mg/dL (ref 7–25)
CO2: 25 mmol/L (ref 20–32)
Calcium: 9.1 mg/dL (ref 8.6–10.2)
Chloride: 106 mmol/L (ref 98–110)
Creat: 0.61 mg/dL (ref 0.50–1.10)
GFR, Est African American: 134 mL/min/{1.73_m2} (ref >=60)
GFR, Est Non African American: 116 mL/min/{1.73_m2} (ref >=60)
Globulin: 2.7 g/dL (calc) (ref 1.9–3.7)
Glucose, Bld: 115 mg/dL — ABNORMAL HIGH (ref 65–99)
Potassium: 4.5 mmol/L (ref 3.5–5.3)
Sodium: 141 mmol/L (ref 135–146)
Total Bilirubin: 0.4 mg/dL (ref 0.2–1.2)
Total Protein: 6.4 g/dL (ref 6.1–8.1)

## 2020-04-15 LAB — CBC WITH DIFFERENTIAL/PLATELET
Absolute Monocytes: 289 cells/uL (ref 200–950)
Basophils Absolute: 8 cells/uL (ref 0–200)
Basophils Relative: 0.1 %
Eosinophils Absolute: 76 cells/uL (ref 15–500)
Eosinophils Relative: 1 %
HCT: 45.8 % — ABNORMAL HIGH (ref 35.0–45.0)
Hemoglobin: 14.8 g/dL (ref 11.7–15.5)
Lymphs Abs: 1854 cells/uL (ref 850–3900)
MCH: 27.3 pg (ref 27.0–33.0)
MCHC: 32.3 g/dL (ref 32.0–36.0)
MCV: 84.3 fL (ref 80.0–100.0)
MPV: 12.5 fL (ref 7.5–12.5)
Monocytes Relative: 3.8 %
Neutro Abs: 5373 cells/uL (ref 1500–7800)
Neutrophils Relative %: 70.7 %
Platelets: 214 10*3/uL (ref 140–400)
RBC: 5.43 10*6/uL — ABNORMAL HIGH (ref 3.80–5.10)
RDW: 13.3 % (ref 11.0–15.0)
Total Lymphocyte: 24.4 %
WBC: 7.6 10*3/uL (ref 3.8–10.8)

## 2020-04-15 LAB — VITAMIN B12: Vitamin B-12: 289 pg/mL (ref 200–1100)

## 2020-04-15 LAB — VITAMIN D 25 HYDROXY (VIT D DEFICIENCY, FRACTURES): Vit D, 25-Hydroxy: 36 ng/mL (ref 30–100)

## 2020-04-15 MED FILL — WEGOVY 0.25 MG/0.5ML SOAJ: 0.25 | 28 days supply | Qty: 2 | Fill #0

## 2020-04-15 NOTE — Progress Notes (Signed)
Can we send stuff for patient to do her own b12 injections. 1080mcg monthly recheck labs in 3 months.

## 2020-04-15 NOTE — Telephone Encounter (Signed)
Received approval for [facility] valid 04/14/2020-10/14/2020. Valid for all doses.

## 2020-04-15 NOTE — Progress Notes (Signed)
Valerie Aguilar,  B12 is low. How much b12 are you taking? If not absorbing regularly then consider shots.  Vitamin D much better at 36.  Glucose is elevated. Please add A1C.  Kidney, liver look good.  HDL, good cholesterol, is low.  LDL, bad cholesterol, is high.  I would suggest a low dose statin to help decrease CV risk and get you to goal.

## 2020-04-16 ENCOUNTER — Other Ambulatory Visit: Payer: Self-pay | Admitting: *Deleted

## 2020-04-16 DIAGNOSIS — E538 Deficiency of other specified B group vitamins: Secondary | ICD-10-CM

## 2020-04-16 DIAGNOSIS — D510 Vitamin B12 deficiency anemia due to intrinsic factor deficiency: Secondary | ICD-10-CM

## 2020-04-16 MED ORDER — CYANOCOBALAMIN 1000 MCG/ML IJ SOLN: 1000.0000 ug | INTRAMUSCULAR | 1 refills | Status: DC

## 2020-04-16 MED FILL — CYANOCOBALAMIN 1,000 MCG/ML: 1000 | 84 days supply | Qty: 3 | Fill #0

## 2020-05-06 ENCOUNTER — Encounter: Payer: Self-pay | Admitting: Physician Assistant

## 2020-05-06 MED ORDER — WEGOVY 0.5 MG/0.5ML ~~LOC~~ SOAJ
0.5000 mg | SUBCUTANEOUS | 0 refills | Status: DC
  Filled 2020-05-06: qty 2, 28d supply, fill #0

## 2020-05-06 NOTE — Telephone Encounter (Signed)
Med sent. Please schedule f/u with Jade in 4 weeks before next dose increase.

## 2020-05-07 ENCOUNTER — Other Ambulatory Visit (HOSPITAL_COMMUNITY): Payer: Self-pay

## 2020-05-13 MED ORDER — WEGOVY 0.5 MG/0.5ML ~~LOC~~ SOAJ: 0.5000 mg | SUBCUTANEOUS | 0 refills | Status: DC

## 2020-05-16 ENCOUNTER — Other Ambulatory Visit (HOSPITAL_COMMUNITY): Payer: Self-pay

## 2020-05-16 MED FILL — Ergocalciferol Cap 1.25 MG (50000 Unit): ORAL | 84 days supply | Qty: 12 | Fill #0 | Status: AC

## 2020-05-20 ENCOUNTER — Encounter: Payer: Self-pay | Admitting: Physician Assistant

## 2020-05-20 ENCOUNTER — Ambulatory Visit: Payer: 59 | Admitting: Physician Assistant

## 2020-05-20 ENCOUNTER — Other Ambulatory Visit (HOSPITAL_COMMUNITY): Payer: Self-pay

## 2020-05-20 ENCOUNTER — Other Ambulatory Visit: Payer: Self-pay

## 2020-05-20 VITALS — BP 141/82 | HR 73 | Ht 67.0 in | Wt 361.0 lb

## 2020-05-20 DIAGNOSIS — Z6841 Body Mass Index (BMI) 40.0 and over, adult: Secondary | ICD-10-CM | POA: Diagnosis not present

## 2020-05-20 DIAGNOSIS — R030 Elevated blood-pressure reading, without diagnosis of hypertension: Secondary | ICD-10-CM | POA: Diagnosis not present

## 2020-05-20 MED ORDER — WEGOVY 2.4 MG/0.75ML ~~LOC~~ SOAJ
2.4000 mg | SUBCUTANEOUS | 0 refills | Status: DC
  Filled 2020-05-20: qty 3, fill #0
  Filled 2020-08-01: qty 3, 28d supply, fill #0

## 2020-05-20 MED ORDER — WEGOVY 1.7 MG/0.75ML ~~LOC~~ SOAJ
1.7000 mg | SUBCUTANEOUS | 0 refills | Status: DC
  Filled 2020-05-20 – 2020-06-30 (×2): qty 3, 28d supply, fill #0

## 2020-05-20 MED ORDER — WEGOVY 1 MG/0.5ML ~~LOC~~ SOAJ
1.0000 mg | SUBCUTANEOUS | 0 refills | Status: DC
  Filled 2020-05-20: qty 2, 28d supply, fill #0

## 2020-05-20 NOTE — Progress Notes (Signed)
   Subjective:    Patient ID: Valerie Aguilar, female    DOB: 05/07/1980, 40 y.o.   MRN: [id number]  HPI  Pt is a 40 yo obese female who presents to the clinic to follow up on weight loss with wegovy.   She is doing GREAT. She had very little nausea associated with the wegovy start. She has lost 7lbs in first month. She is on first dose of .5mg . she is walking a few times a week. She is eating smaller meals but more frequently.   .. Active Ambulatory Problems    Diagnosis Date Noted  . Pre-diabetes 05/18/2013  . Dyslipidemia 06/04/2013  . Severe obesity (BMI >= 40) ([place]) 06/04/2013  . Right knee pain 09/25/2014  . Vitamin D deficiency 04/21/2015  . 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 Pernicious anemia 04/14/2016  . 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 B12 deficiency 11/29/2016  . Chronic pain of left knee 04/14/2020  . Chronic left shoulder pain 04/14/2020  . Elevated fasting glucose 04/15/2020  . Class 3 severe obesity due to excess calories without serious comorbidity with body mass index (BMI) of 50.0 to 59.9 in adult [facility]) 05/20/2020   Resolved Ambulatory Problems    Diagnosis Date Noted  . Essential hypertension, benign 05/11/2013  . Morbid obesity with body mass index (BMI) of 60.0 to 69.9 in adult [facility]) 08/26/2015   Past Medical History:  Diagnosis Date  . Abrasion of right eye   . Arthritis   . Cancer ([place])   . Diabetes mellitus without complication ([place])   . Hyperlipidemia   . Hypertension   . Obesity, morbid ([place])     Review of Systems  All other systems reviewed and are negative.      Objective:   Physical Exam Vitals reviewed.  Constitutional:      Appearance: Normal appearance. She is obese.  HENT:     Head: Normocephalic.  Cardiovascular:     Rate and Rhythm: Normal rate and regular rhythm.     Pulses: Normal pulses.  Pulmonary:     Effort: Pulmonary effort is normal.   Musculoskeletal:     Cervical back: Normal range of motion.  Neurological:     Mental Status: She is alert.  Psychiatric:        Mood and Affect: Mood normal.           Assessment & Plan:  .Marland KitchenSherleen was seen today for weight check.  Diagnoses and all orders for this visit:  Class 3 severe obesity due to excess calories without serious comorbidity with body mass index (BMI) of 50.0 to 59.9 in adult (HCC) -     Semaglutide-Weight Management (WEGOVY) 1 MG/0.5ML SOAJ; Inject 1 mg into the skin once a week. -     Semaglutide-Weight Management (WEGOVY) 1.7 MG/0.75ML SOAJ; Inject 1.7 mg into the skin once a week. -     Semaglutide-Weight Management (WEGOVY) 2.4 MG/0.75ML SOAJ; Inject 2.4 mg into the skin once a week.  doing amazing.  All 3 next dosages sent to pharmacy.  Goal weight loss in next 3 months is 25lbs.  Discussed 100 calorie snacks with 2 nutritious meals.  Checking BP at home and work and running 120/80s.

## 2020-06-30 ENCOUNTER — Other Ambulatory Visit (HOSPITAL_COMMUNITY): Payer: Self-pay

## 2020-07-15 ENCOUNTER — Other Ambulatory Visit (HOSPITAL_COMMUNITY): Payer: Self-pay

## 2020-07-15 MED ORDER — CHLORHEXIDINE GLUCONATE 0.12 % MT SOLN
OROMUCOSAL | 1 refills | Status: DC
  Filled 2020-07-15: qty 473, 7d supply, fill #0

## 2020-08-01 ENCOUNTER — Other Ambulatory Visit (HOSPITAL_COMMUNITY): Payer: Self-pay

## 2020-08-20 ENCOUNTER — Other Ambulatory Visit (HOSPITAL_COMMUNITY): Payer: Self-pay

## 2020-08-20 ENCOUNTER — Encounter: Payer: Self-pay | Admitting: Physician Assistant

## 2020-08-20 MED ORDER — CARESTART COVID-19 HOME TEST VI KIT
PACK | 0 refills | Status: DC
  Filled 2020-08-20: qty 4, 4d supply, fill #0

## 2020-08-22 ENCOUNTER — Other Ambulatory Visit (HOSPITAL_COMMUNITY): Payer: Self-pay

## 2020-08-22 ENCOUNTER — Ambulatory Visit (INDEPENDENT_AMBULATORY_CARE_PROVIDER_SITE_OTHER): Payer: 59 | Admitting: Physician Assistant

## 2020-08-22 ENCOUNTER — Encounter: Payer: Self-pay | Admitting: Physician Assistant

## 2020-08-22 VITALS — BP 130/54 | HR 68 | Temp 97.9°F | Ht 67.0 in | Wt 345.0 lb

## 2020-08-22 DIAGNOSIS — E782 Mixed hyperlipidemia: Secondary | ICD-10-CM

## 2020-08-22 DIAGNOSIS — R030 Elevated blood-pressure reading, without diagnosis of hypertension: Secondary | ICD-10-CM | POA: Diagnosis not present

## 2020-08-22 DIAGNOSIS — Z6841 Body Mass Index (BMI) 40.0 and over, adult: Secondary | ICD-10-CM | POA: Diagnosis not present

## 2020-08-22 DIAGNOSIS — E559 Vitamin D deficiency, unspecified: Secondary | ICD-10-CM | POA: Diagnosis not present

## 2020-08-22 DIAGNOSIS — R110 Nausea: Secondary | ICD-10-CM | POA: Diagnosis not present

## 2020-08-22 MED ORDER — VITAMIN D (ERGOCALCIFEROL) 1.25 MG (50000 UNIT) PO CAPS
ORAL_CAPSULE | ORAL | 1 refills | Status: DC
  Filled 2020-08-22: qty 12, 84d supply, fill #0
  Filled 2021-01-03: qty 12, 84d supply, fill #1

## 2020-08-22 MED ORDER — ONDANSETRON HCL 8 MG PO TABS
8.0000 mg | ORAL_TABLET | Freq: Three times a day (TID) | ORAL | 0 refills | Status: DC | PRN
  Filled 2020-08-22: qty 20, 7d supply, fill #0

## 2020-08-22 MED ORDER — WEGOVY 2.4 MG/0.75ML ~~LOC~~ SOAJ
2.4000 mg | SUBCUTANEOUS | 5 refills | Status: DC
  Filled 2020-08-22: qty 3, 28d supply, fill #0
  Filled 2020-09-25: qty 3, 28d supply, fill #1
  Filled 2020-10-22 – 2020-10-30 (×2): qty 3, 28d supply, fill #2
  Filled 2020-11-30: qty 3, 28d supply, fill #3
  Filled 2020-12-28: qty 3, 28d supply, fill #4
  Filled 2021-01-26: qty 3, 28d supply, fill #5

## 2020-08-22 MED ORDER — ROSUVASTATIN CALCIUM 5 MG PO TABS
5.0000 mg | ORAL_TABLET | Freq: Every day | ORAL | 3 refills | Status: DC
  Filled 2020-08-22: qty 90, 90d supply, fill #0
  Filled 2020-12-22: qty 90, 90d supply, fill #1
  Filled 2021-04-21: qty 90, 90d supply, fill #2

## 2020-08-22 NOTE — Progress Notes (Signed)
Subjective:    Patient ID: Valerie Aguilar, female    DOB: [birth year], 40 y.o.   MRN: [id number]  HPI Valerie Aguilar is a 40 yo obese female with dyslipidemia, pre diabetes, vitamin D def, pernicious anemia who presents to the clinic for medication management.   Valerie Aguilar is on wegovy and tolerating fairly well. Valerie Aguilar has lost 16lbs in last 3 months. Valerie Aguilar would like to continue.   No CP, palpitations, dizziness, headaches.   .. 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   B12 deficiency 11/29/2016   Chronic pain of left knee 04/14/2020   Chronic left shoulder pain 04/14/2020   Elevated fasting glucose 04/15/2020   Class 3 severe obesity due to excess calories without serious comorbidity with body mass index (BMI) of 50.0 to 59.9 in adult ([place]) 05/20/2020   Elevated blood pressure reading 05/20/2020   Nausea 08/24/2020   Resolved Ambulatory Problems    Diagnosis Date Noted   Essential hypertension, benign 05/11/2013   Morbid obesity with body mass index (BMI) of 60.0 to 69.9 in adult [facility]) 08/26/2015   Past Medical History:  Diagnosis Date   Abrasion of right eye    Arthritis    Cancer ([place])    Diabetes mellitus without complication ([place])    Hyperlipidemia    Hypertension    Obesity, morbid ([place])       Review of Systems See HPI.    Objective:   Physical Exam Vitals reviewed.  Constitutional:      Appearance: Normal appearance. Valerie Aguilar is obese.  HENT:     Head: Normocephalic.  Neck:     Vascular: No carotid bruit.  Cardiovascular:     Rate and Rhythm: Normal rate and regular rhythm.     Pulses: Normal pulses.     Heart sounds: Normal heart sounds.   Pulmonary:     Effort: Pulmonary effort is normal.     Breath sounds: Normal breath sounds.  Musculoskeletal:     Right lower leg: No edema.     Left lower leg: No edema.  Lymphadenopathy:     Cervical: No cervical adenopathy.  Neurological:     General: No focal deficit present.     Mental Status: Valerie Aguilar is alert and oriented to person, place, and time.  Psychiatric:        Mood and Affect: Mood normal.          Assessment & Plan:  .Valerie Aguilar was seen today for obesity, weight check and elevated bp without hypertension.  Diagnoses and all orders for this visit:  Class 3 severe obesity due to excess calories without serious comorbidity with body mass index (BMI) of 50.0 to 59.9 in adult (HCC) -     Semaglutide-Weight Management (WEGOVY) 2.4 MG/0.75ML SOAJ; Inject 2.4 mg into the skin once a week.  Elevated blood pressure reading  Nausea -     ondansetron (ZOFRAN) 8 MG tablet; Take 1 tablet (8 mg total) by mouth every 8 (eight) hours as needed for nausea or vomiting.  Mixed hyperlipidemia -     rosuvastatin (CRESTOR) 5 MG tablet; Take 1 tablet (5 mg total) by mouth daily.  Vitamin D deficiency -  Vitamin D, Ergocalciferol, (DRISDOL) 1.25 MG (50000 UNIT) CAPS capsule; TAKE 1 CAPSULE (50,000 UNITS TOTAL) BY MOUTH ONCE A WEEK.  Reviewed labs.  Started vitamin D high dose.  Start crestor.   BP not to goal. Start checking BP at home. If ranging above 140/90 then need to discuss starting medication.   Valerie Kitchen.Discussed low carb diet with 1500 calories and 80g of protein.  Exercising at least 150 minutes a week.  [employer] could be a Microbiologist.  Continue wegovy.  Zofran as needed for nausea after the shot.  Follow up in 6 months.

## 2020-08-24 ENCOUNTER — Encounter: Payer: Self-pay | Admitting: Physician Assistant

## 2020-08-24 DIAGNOSIS — R110 Nausea: Secondary | ICD-10-CM | POA: Insufficient documentation

## 2020-08-25 ENCOUNTER — Other Ambulatory Visit (HOSPITAL_COMMUNITY): Payer: Self-pay

## 2020-08-25 MED FILL — Cyanocobalamin Inj 1000 MCG/ML: INTRAMUSCULAR | 90 days supply | Qty: 3 | Fill #0 | Status: AC

## 2020-08-26 ENCOUNTER — Other Ambulatory Visit (HOSPITAL_COMMUNITY): Payer: Self-pay

## 2020-09-10 DIAGNOSIS — H10413 Chronic giant papillary conjunctivitis, bilateral: Secondary | ICD-10-CM | POA: Diagnosis not present

## 2020-09-10 DIAGNOSIS — H16223 Keratoconjunctivitis sicca, not specified as Sjogren's, bilateral: Secondary | ICD-10-CM | POA: Diagnosis not present

## 2020-09-10 DIAGNOSIS — H5213 Myopia, bilateral: Secondary | ICD-10-CM | POA: Diagnosis not present

## 2020-09-25 ENCOUNTER — Other Ambulatory Visit (HOSPITAL_COMMUNITY): Payer: Self-pay

## 2020-10-22 ENCOUNTER — Encounter: Payer: Self-pay | Admitting: Physician Assistant

## 2020-10-22 ENCOUNTER — Other Ambulatory Visit (HOSPITAL_COMMUNITY): Payer: Self-pay

## 2020-10-27 ENCOUNTER — Telehealth: Payer: Self-pay

## 2020-10-27 NOTE — Telephone Encounter (Signed)
Medication: Semaglutide-Weight Management (WEGOVY) 2.4 MG/0.75ML SOAJ Prior authorization determination received Medication has been approved Approval dates: 10/27/2020 to 10/26/2021  Patient aware via: [place] aware: Yes Provider aware via this encounter

## 2020-10-27 NOTE — Telephone Encounter (Signed)
Medication: Semaglutide-Weight Management (WEGOVY) 2.4 MG/0.75ML SOAJ Prior authorization submitted via CoverMyMeds on 10/27/2020 PA submission pending

## 2020-10-30 ENCOUNTER — Other Ambulatory Visit (HOSPITAL_COMMUNITY): Payer: Self-pay

## 2020-10-31 ENCOUNTER — Other Ambulatory Visit (HOSPITAL_COMMUNITY): Payer: Self-pay

## 2020-11-27 ENCOUNTER — Encounter: Payer: Self-pay | Admitting: Physician Assistant

## 2020-11-30 ENCOUNTER — Other Ambulatory Visit: Payer: Self-pay | Admitting: Physician Assistant

## 2020-11-30 DIAGNOSIS — R110 Nausea: Secondary | ICD-10-CM

## 2020-12-01 ENCOUNTER — Other Ambulatory Visit (HOSPITAL_COMMUNITY): Payer: Self-pay

## 2020-12-01 MED ORDER — ONDANSETRON HCL 8 MG PO TABS
8.0000 mg | ORAL_TABLET | Freq: Three times a day (TID) | ORAL | 0 refills | Status: DC | PRN
  Filled 2020-12-01: qty 20, 7d supply, fill #0

## 2020-12-01 NOTE — Telephone Encounter (Signed)
Last written in August #20 no refills Last appt in August. Please advise.

## 2020-12-22 ENCOUNTER — Other Ambulatory Visit (HOSPITAL_COMMUNITY): Payer: Self-pay

## 2020-12-29 ENCOUNTER — Other Ambulatory Visit (HOSPITAL_COMMUNITY): Payer: Self-pay

## 2021-01-05 ENCOUNTER — Other Ambulatory Visit (HOSPITAL_COMMUNITY): Payer: Self-pay

## 2021-01-27 ENCOUNTER — Other Ambulatory Visit (HOSPITAL_COMMUNITY): Payer: Self-pay

## 2021-02-19 ENCOUNTER — Encounter: Payer: Self-pay | Admitting: Physician Assistant

## 2021-02-20 ENCOUNTER — Encounter: Payer: Self-pay | Admitting: Physician Assistant

## 2021-02-20 ENCOUNTER — Telehealth: Payer: 59 | Admitting: Physician Assistant

## 2021-02-20 ENCOUNTER — Other Ambulatory Visit: Payer: Self-pay

## 2021-02-20 VITALS — Temp 100.0°F | Ht 67.0 in | Wt 342.0 lb

## 2021-02-20 DIAGNOSIS — J069 Acute upper respiratory infection, unspecified: Secondary | ICD-10-CM | POA: Diagnosis not present

## 2021-02-20 DIAGNOSIS — R6889 Other general symptoms and signs: Secondary | ICD-10-CM

## 2021-02-20 NOTE — Progress Notes (Signed)
Yesterday nausea vomiting body chill and diarrhea and sore throat and jaw pain and bilateral ear pain.

## 2021-02-20 NOTE — Progress Notes (Signed)
..  Virtual Visit via Telephone Note  I connected with Valerie Aguilar on 02/20/21 at 11:30 AM EST by telephone and verified that I am speaking with the correct person using two identifiers.  Location: Patient: home Provider: clinic  .Marland KitchenParticipating in visit:  Patient: home Provider: Iran Planas PA-C   I discussed the limitations, risks, security and privacy concerns of performing an evaluation and management service by telephone and the availability of in person appointments. I also discussed with the patient that there may be a patient responsible charge related to this service. The patient expressed understanding and agreed to proceed.   History of Present Illness: Valerie Aguilar is a 41 yo female who calls into the clinic with nausea and vomiting that started Wednesday night. She has had low grade fever with chills, body aches, and sore throat. She has tested negative for covid and has vaccination. She has not had any more vomiting. Feeling a little better today. She has some sinus pressure that comes and goes.   .. 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   B12 deficiency 11/29/2016   Chronic pain of left knee 04/14/2020   Chronic left shoulder pain 04/14/2020   Elevated fasting glucose 04/15/2020   Class 3 severe obesity due to excess calories without serious comorbidity with body mass index (BMI) of 50.0 to 59.9 in adult ([place]) 05/20/2020   Elevated blood pressure reading 05/20/2020   Nausea 08/24/2020   Resolved Ambulatory Problems    Diagnosis Date Noted   Essential hypertension, benign 05/11/2013   Morbid obesity with body mass index (BMI) of 60.0  to 69.9 in adult [facility]) 08/26/2015   Past Medical History:  Diagnosis Date   Abrasion of right eye    Arthritis    Cancer ([place])    Diabetes mellitus without complication ([place])    Hyperlipidemia    Hypertension    Obesity, morbid ([place])       Observations/Objective: No acute distress Normal breathing  .Marland Kitchen Today's Vitals   02/20/21 1113  Temp: 100 F (37.8 C)  Weight: (!) 342 lb (155.1 kg)  Height: 5\' 7"  (1.702 m)   Body mass index is 53.56 kg/m.    Assessment and Plan: .Marland KitchenLajean was seen today for nausea.  Diagnoses and all orders for this visit:  Viral URI  Flu-like symptoms   Seems to be getting better. Discussed coming in for flu testing if she would want to start tamiflu. She decided not to.  Continue symptomatic care. Written out for work Wednesday through Friday.  Ok to go back to work on Monday if fever free for 24 hours.     Follow Up Instructions:    I discussed the assessment and treatment plan with the patient. The patient was provided an opportunity to ask questions and all were answered. The patient agreed with the plan and demonstrated an understanding of the instructions.   The patient was advised to call back or seek an in-person evaluation if the symptoms worsen or if the condition fails to improve as anticipated.  I provided 10 minutes of non-face-to-face time during this encounter.   Iran Planas, PA-C

## 2021-02-27 ENCOUNTER — Other Ambulatory Visit: Payer: Self-pay

## 2021-02-27 ENCOUNTER — Other Ambulatory Visit (HOSPITAL_COMMUNITY): Payer: Self-pay

## 2021-02-27 ENCOUNTER — Encounter: Payer: Self-pay | Admitting: Physician Assistant

## 2021-02-27 ENCOUNTER — Ambulatory Visit: Payer: 59 | Admitting: Physician Assistant

## 2021-02-27 VITALS — BP 137/82 | HR 70 | Ht 67.0 in | Wt 313.0 lb

## 2021-02-27 DIAGNOSIS — Z79899 Other long term (current) drug therapy: Secondary | ICD-10-CM | POA: Diagnosis not present

## 2021-02-27 DIAGNOSIS — Z6841 Body Mass Index (BMI) 40.0 and over, adult: Secondary | ICD-10-CM | POA: Diagnosis not present

## 2021-02-27 DIAGNOSIS — E782 Mixed hyperlipidemia: Secondary | ICD-10-CM | POA: Diagnosis not present

## 2021-02-27 DIAGNOSIS — R7303 Prediabetes: Secondary | ICD-10-CM

## 2021-02-27 DIAGNOSIS — E559 Vitamin D deficiency, unspecified: Secondary | ICD-10-CM

## 2021-02-27 DIAGNOSIS — Z1329 Encounter for screening for other suspected endocrine disorder: Secondary | ICD-10-CM | POA: Diagnosis not present

## 2021-02-27 DIAGNOSIS — E538 Deficiency of other specified B group vitamins: Secondary | ICD-10-CM | POA: Diagnosis not present

## 2021-02-27 MED ORDER — WEGOVY 2.4 MG/0.75ML ~~LOC~~ SOAJ
2.4000 mg | SUBCUTANEOUS | 5 refills | Status: DC
  Filled 2021-02-27: qty 3, 28d supply, fill #0
  Filled 2021-03-23: qty 3, 28d supply, fill #1
  Filled 2021-04-21: qty 3, 28d supply, fill #2
  Filled 2021-05-11 – 2021-05-13 (×2): qty 3, 28d supply, fill #3

## 2021-02-27 MED ORDER — VITAMIN D (ERGOCALCIFEROL) 1.25 MG (50000 UNIT) PO CAPS
50000.0000 [IU] | ORAL_CAPSULE | ORAL | 3 refills | Status: DC
  Filled 2021-02-27: qty 12, fill #0
  Filled 2021-04-02: qty 12, 84d supply, fill #0

## 2021-02-27 MED ORDER — VITAMIN D 25 MCG (1000 UNIT) PO TABS
1000.0000 [IU] | ORAL_TABLET | Freq: Every day | ORAL | 3 refills | Status: DC
Start: 2021-02-27 — End: 2021-02-27
  Filled 2021-02-27: qty 12, 12d supply, fill #0

## 2021-02-27 NOTE — Addendum Note (Signed)
Addended by: Donella Stade on: 02/27/2021 09:30 AM   Modules accepted: Orders

## 2021-02-27 NOTE — Progress Notes (Signed)
Subjective:    Patient ID: Valerie Aguilar, female    DOB: 1980/02/01, 41 y.o.   MRN: [id number]  HPI Pt is a 41 yo obese female who presents to the clinic to follow up on weight loss. She started out at 368 and down to 345 in august and down to 313 today. She is very happy with weogovy and tolerating well. No problems or concerns. She is moving more and having more energy.   .. 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   B12 deficiency 11/29/2016   Chronic pain of left knee 04/14/2020   Chronic left shoulder pain 04/14/2020   Elevated fasting glucose 04/15/2020   Class 3 severe obesity due to excess calories without serious comorbidity with body mass index (BMI) of 50.0 to 59.9 in adult ([place]) 05/20/2020   Elevated blood pressure reading 05/20/2020   Nausea 08/24/2020   Mixed hyperlipidemia 02/27/2021   Resolved Ambulatory Problems    Diagnosis Date Noted   Essential hypertension, benign 05/11/2013   Morbid obesity with body mass index (BMI) of 60.0 to 69.9 in adult [facility]) 08/26/2015   Past Medical History:  Diagnosis Date   Abrasion of right eye    Arthritis    Cancer ([place])    Diabetes mellitus without complication ([place])    Hyperlipidemia    Hypertension    Obesity, morbid ([place])       Review of Systems  All other systems reviewed and are negative.     Objective:   Physical Exam Vitals reviewed.  Constitutional:      Appearance: Normal appearance. She is obese.  HENT:     Head: Normocephalic.  Cardiovascular:     Rate and Rhythm: Normal rate and regular rhythm.     Pulses: Normal pulses.     Heart sounds: Normal heart sounds.   Pulmonary:     Effort: Pulmonary effort is normal.     Breath sounds: Normal breath sounds.  Musculoskeletal:     Right lower leg: No edema.     Left lower leg: No edema.  Neurological:     General: No focal deficit present.     Mental Status: She is alert and oriented to person, place, and time.  Psychiatric:        Mood and Affect: Mood normal.          Assessment & Plan:  .Marland KitchenElim was seen today for follow-up.  Diagnoses and all orders for this visit:  Class 3 severe obesity due to excess calories without serious comorbidity with body mass index (BMI) of 45.0 to 49.9 in adult (HCC) -     TSH -     Lipid Panel w/reflex Direct LDL -     COMPLETE METABOLIC PANEL WITH GFR -     CBC with Differential/Platelet -     Vitamin D (25 hydroxy) -     Vitamin B12 -     Semaglutide-Weight Management (WEGOVY) 2.4 MG/0.75ML SOAJ; Inject 2.4 mg into the skin once a week.  Mixed hyperlipidemia -     Lipid Panel w/reflex Direct LDL  Vitamin D deficiency -     Vitamin D (25 hydroxy) -  cholecalciferol (VITAMIN D3) 25 MCG (1000 UNIT) tablet; Take 1 tablet (1,000 Units total) by mouth daily.  B12 deficiency -     Vitamin B12  Pre-diabetes -     COMPLETE METABOLIC PANEL WITH GFR  Thyroid disorder screen -     TSH  Medication management -     TSH -     Lipid Panel w/reflex Direct LDL -     COMPLETE METABOLIC PANEL WITH GFR -     CBC with Differential/Platelet -     Vitamin D (25 hydroxy) -     Vitamin B12   Needs labs.  Doing great on Wegovy.  Refilled for 6 months Continue with diet and exercise.

## 2021-02-28 LAB — COMPLETE METABOLIC PANEL WITH GFR
AG Ratio: 2 (calc) (ref 1.0–2.5)
ALT: 25 U/L (ref 6–29)
AST: 23 U/L (ref 10–30)
Albumin: 4.1 g/dL (ref 3.6–5.1)
Alkaline phosphatase (APISO): 88 U/L (ref 31–125)
BUN: 9 mg/dL (ref 7–25)
CO2: 29 mmol/L (ref 20–32)
Calcium: 9.3 mg/dL (ref 8.6–10.2)
Chloride: 104 mmol/L (ref 98–110)
Creat: 0.76 mg/dL (ref 0.50–0.97)
Globulin: 2.1 g/dL (calc) (ref 1.9–3.7)
Glucose, Bld: 86 mg/dL (ref 65–99)
Potassium: 4.5 mmol/L (ref 3.5–5.3)
Sodium: 141 mmol/L (ref 135–146)
Total Bilirubin: 0.7 mg/dL (ref 0.2–1.2)
Total Protein: 6.2 g/dL (ref 6.1–8.1)
eGFR: 103 mL/min/{1.73_m2} (ref >=60)

## 2021-02-28 LAB — VITAMIN D 25 HYDROXY (VIT D DEFICIENCY, FRACTURES): Vit D, 25-Hydroxy: 82 ng/mL (ref 30–100)

## 2021-02-28 LAB — CBC WITH DIFFERENTIAL/PLATELET
Absolute Monocytes: 292 cells/uL (ref 200–950)
Basophils Absolute: 27 cells/uL (ref 0–200)
Basophils Relative: 0.4 %
Eosinophils Absolute: 61 cells/uL (ref 15–500)
Eosinophils Relative: 0.9 %
HCT: 44 % (ref 35.0–45.0)
Hemoglobin: 14.4 g/dL (ref 11.7–15.5)
Lymphs Abs: 1829 cells/uL (ref 850–3900)
MCH: 28 pg (ref 27.0–33.0)
MCHC: 32.7 g/dL (ref 32.0–36.0)
MCV: 85.6 fL (ref 80.0–100.0)
MPV: 12.1 fL (ref 7.5–12.5)
Monocytes Relative: 4.3 %
Neutro Abs: 4590 cells/uL (ref 1500–7800)
Neutrophils Relative %: 67.5 %
Platelets: 215 10*3/uL (ref 140–400)
RBC: 5.14 10*6/uL — ABNORMAL HIGH (ref 3.80–5.10)
RDW: 12.5 % (ref 11.0–15.0)
Total Lymphocyte: 26.9 %
WBC: 6.8 10*3/uL (ref 3.8–10.8)

## 2021-02-28 LAB — LIPID PANEL W/REFLEX DIRECT LDL
Cholesterol: 134 mg/dL (ref <200)
HDL: 32 mg/dL — ABNORMAL LOW (ref >=50)
LDL Cholesterol (Calc): 81 mg/dL (calc)
Non-HDL Cholesterol (Calc): 102 mg/dL (calc) (ref <130)
Total CHOL/HDL Ratio: 4.2 (calc) (ref <5.0)
Triglycerides: 116 mg/dL (ref <150)

## 2021-02-28 LAB — VITAMIN B12: Vitamin B-12: 319 pg/mL (ref 200–1100)

## 2021-02-28 LAB — TSH: TSH: 2.16 mIU/L

## 2021-02-28 MED FILL — Cyanocobalamin Inj 1000 MCG/ML: INTRAMUSCULAR | 90 days supply | Qty: 3 | Fill #1 | Status: AC

## 2021-03-02 ENCOUNTER — Encounter: Payer: Self-pay | Admitting: Physician Assistant

## 2021-03-02 ENCOUNTER — Other Ambulatory Visit (HOSPITAL_COMMUNITY): Payer: Self-pay

## 2021-03-02 DIAGNOSIS — E538 Deficiency of other specified B group vitamins: Secondary | ICD-10-CM

## 2021-03-02 DIAGNOSIS — D510 Vitamin B12 deficiency anemia due to intrinsic factor deficiency: Secondary | ICD-10-CM

## 2021-03-02 MED ORDER — CYANOCOBALAMIN 1000 MCG/ML IJ SOLN: INTRAMUSCULAR | 1 refills | Status: AC

## 2021-03-02 NOTE — Progress Notes (Signed)
Your cholesterol looks so much better.  Your good cholesterol did drop a little. I am hoping as you get more active that this will raise a little.  Thyroid looks great.  Kidney, liver, glucose look great.  Vitamin D is great.  B12 is better. Would like to see it get above 400. How much are you taking?

## 2021-03-24 ENCOUNTER — Other Ambulatory Visit (HOSPITAL_COMMUNITY): Payer: Self-pay

## 2021-04-03 ENCOUNTER — Other Ambulatory Visit (HOSPITAL_COMMUNITY): Payer: Self-pay

## 2021-04-22 ENCOUNTER — Other Ambulatory Visit (HOSPITAL_COMMUNITY): Payer: Self-pay

## 2021-05-11 ENCOUNTER — Other Ambulatory Visit (HOSPITAL_COMMUNITY): Payer: Self-pay

## 2021-05-11 MED ORDER — AMOXICILLIN 500 MG PO CAPS
ORAL_CAPSULE | ORAL | 1 refills | Status: DC
  Filled 2021-05-11: qty 30, 9d supply, fill #0

## 2021-05-11 MED ORDER — CHLORHEXIDINE GLUCONATE 0.12 % MT SOLN
15.0000 mL | Freq: Two times a day (BID) | OROMUCOSAL | 0 refills | Status: DC
  Filled 2021-05-11: qty 473, 16d supply, fill #0

## 2021-05-13 ENCOUNTER — Other Ambulatory Visit (HOSPITAL_COMMUNITY): Payer: Self-pay

## 2021-05-14 ENCOUNTER — Other Ambulatory Visit (HOSPITAL_COMMUNITY): Payer: Self-pay

## 2021-05-14 MED ORDER — DOXYCYCLINE HYCLATE 100 MG PO CAPS
ORAL_CAPSULE | ORAL | 1 refills | Status: DC
  Filled 2021-05-14: qty 30, 30d supply, fill #0

## 2021-06-01 ENCOUNTER — Encounter: Payer: Self-pay | Admitting: Physician Assistant

## 2021-06-09 ENCOUNTER — Other Ambulatory Visit (HOSPITAL_COMMUNITY): Payer: Self-pay

## 2021-06-10 ENCOUNTER — Telehealth: Payer: Self-pay

## 2021-06-10 NOTE — Telephone Encounter (Addendum)
Initiated Prior authorization [id number] 2.'4MG'$ /0.75ML auto-injectors Via: Covermymeds Case/Key:[id number] Status: denied  as of 06/10/21 Reason:Per your health plan's criteria, this drug is covered if you meet the following: You are not taking this drug with any of the following: Semaglutide-containing products (for example: Ozempic, Rybelsus) and glucagon-like peptide-1 receptor agonists (for example: Saxenda, Trulicity, Victoza). The information provided does not show that you meet the criteria listed above. Notified Pt via: Mychart

## 2021-06-12 ENCOUNTER — Encounter: Payer: Self-pay | Admitting: Physician Assistant

## 2021-06-12 NOTE — Telephone Encounter (Signed)
Hi Key, can we initiate an appeal.  Did they say why denied.Also I would rec pt call her [employer] to see what is covered.

## 2021-06-16 ENCOUNTER — Telehealth: Payer: Self-pay

## 2021-06-16 NOTE — Telephone Encounter (Signed)
Resubmission Prior authorization [id number]:[id number] 2.'4MG'$ /0.75ML auto-injectors Via: Covermymeds/ called [employer] rx Case/Key:[id number] Status: approved  as of 5/30-23 -01/16/22 Reason:n/a Notified Pt via: Mychart, called pt left vm

## 2021-08-04 ENCOUNTER — Other Ambulatory Visit: Payer: Self-pay | Admitting: Physician Assistant

## 2021-08-19 ENCOUNTER — Encounter: Payer: Self-pay | Admitting: Neurology

## 2021-08-28 ENCOUNTER — Ambulatory Visit (INDEPENDENT_AMBULATORY_CARE_PROVIDER_SITE_OTHER): Payer: PRIVATE HEALTH INSURANCE | Admitting: Physician Assistant

## 2021-08-28 DIAGNOSIS — Z6841 Body Mass Index (BMI) 40.0 and over, adult: Secondary | ICD-10-CM

## 2021-08-28 DIAGNOSIS — E559 Vitamin D deficiency, unspecified: Secondary | ICD-10-CM

## 2021-08-28 DIAGNOSIS — E782 Mixed hyperlipidemia: Secondary | ICD-10-CM

## 2021-08-28 MED ORDER — VITAMIN D (ERGOCALCIFEROL) 1.25 MG (50000 UNIT) PO CAPS: 50000.0000 [IU] | ORAL_CAPSULE | ORAL | 3 refills | Status: AC

## 2021-08-28 MED ORDER — ROSUVASTATIN CALCIUM 5 MG PO TABS: 5.0000 mg | ORAL_TABLET | Freq: Every day | ORAL | 3 refills | Status: AC

## 2021-08-28 MED ORDER — WEGOVY 2.4 MG/0.75ML ~~LOC~~ SOAJ: 2.4000 mg | SUBCUTANEOUS | 5 refills | Status: DC

## 2021-08-28 NOTE — Progress Notes (Signed)
   Established Patient Office Visit  Subjective   Patient ID: KEIMANI LAUFER, female    DOB: 06/13/1980  Age: 41 y.o. MRN: [id number]  Chief Complaint  Patient presents with  . Follow-up    HPI 368 start now 322  {History (Optional):23778}  ROS    Objective:     BP 137/81   Pulse 72   Ht '5\' 7"'$  (1.702 m)   Wt (!) 322 lb (146.1 kg)   SpO2 99%   BMI 50.43 kg/m  {Vitals History (Optional):23777}  Physical Exam   No results found for any visits on 08/28/21.  {Labs (Optional):23779}  The ASCVD Risk score (Arnett DK, et al., 2019) failed to calculate for the following reasons:   The 2019 ASCVD risk score is only valid for ages 20 to 78    Assessment & Plan:   Problem List Items Addressed This Visit       Unprioritized   Vitamin D deficiency   Mixed hyperlipidemia   Other Visit Diagnoses     Class 3 severe obesity due to excess calories without serious comorbidity with body mass index (BMI) of 45.0 to 49.9 in adult [facility])           No follow-ups on file.    Iran Planas, PA-C

## 2021-08-31 ENCOUNTER — Encounter: Payer: Self-pay | Admitting: Physician Assistant

## 2021-11-02 ENCOUNTER — Encounter: Payer: Self-pay | Admitting: Physician Assistant

## 2021-11-03 ENCOUNTER — Other Ambulatory Visit: Payer: Self-pay | Admitting: Physician Assistant

## 2021-11-03 MED ORDER — WEGOVY 1 MG/0.5ML ~~LOC~~ SOAJ: 1.0000 mg | SUBCUTANEOUS | 0 refills | Status: DC

## 2021-11-03 NOTE — Progress Notes (Signed)
Off for 3 weeks of wegovy and needs to titrate back up.

## 2021-11-04 ENCOUNTER — Other Ambulatory Visit: Payer: Self-pay | Admitting: Obstetrics

## 2021-11-16 NOTE — Pre-Procedure Instructions (Signed)
Surgical Instructions    Your procedure is scheduled on Monday, November 6.  Report to [facility] Main Entrance "A" at 8:30 A.M., then check in with the Admitting office.  Call this number if you have problems the morning of surgery:  [phone]   If you have any questions prior to your surgery date call [phone]: Open Monday-Friday 8am-4pm If you experience any cold or flu symptoms such as cough, fever, chills, shortness of breath, etc. between now and your scheduled surgery, please notify Valerie Aguilar at the above number     Remember:  Do not eat after midnight the night before your surgery  You may drink clear liquids until 7:30AM the morning of your surgery.   Clear liquids allowed are: Water, Non-Citrus Juices (without pulp), Carbonated Beverages, Clear Tea, Black Coffee ONLY (NO MILK, CREAM OR POWDERED CREAMER of any kind), and Gatorade    Take these medicines the morning of surgery with A SIP OF WATER:  cetirizine (ZYRTEC)  norethindrone (MICRONOR) rosuvastatin (CRESTOR)    As of today, STOP taking any Aspirin (unless otherwise instructed by your surgeon) Aleve, Naproxen, Ibuprofen, Motrin, Advil, Goody's, BC's, all herbal medications, fish oil, and all vitamins.  WHAT DO I DO ABOUT MY DIABETES MEDICATION?  DO NOT TAKE Semaglutide-Weight Management (WEGOVY) for 7 days prior to surgery. Do not take after October 29.  The day of surgery, do not take other diabetes injectables, including Byetta (exenatide), Bydureon (exenatide ER), Victoza (liraglutide), or Trulicity (dulaglutide).   HOW TO MANAGE YOUR DIABETES BEFORE AND AFTER SURGERY  Why is it important to control my blood sugar before and after surgery? Improving blood sugar levels before and after surgery helps healing and can limit problems. A way of improving blood sugar control is eating a healthy diet by:  Eating less sugar and carbohydrates  Increasing activity/exercise  Talking with your doctor about reaching your  blood sugar goals High blood sugars (greater than 180 mg/dL) can raise your risk of infections and slow your recovery, so you will need to focus on controlling your diabetes during the weeks before surgery. Make sure that the doctor who takes care of your diabetes knows about your planned surgery including the date and location.  How do I manage my blood sugar before surgery? Check your blood sugar at least 4 times a day, starting 2 days before surgery, to make sure that the level is not too high or low.  Check your blood sugar the morning of your surgery when you wake up and every 2 hours until you get to the Short Stay unit.  If your blood sugar is less than 70 mg/dL, you will need to treat for low blood sugar: Do not take insulin. Treat a low blood sugar (less than 70 mg/dL) with  cup of clear juice (cranberry or apple), 4 glucose tablets, OR glucose gel. Recheck blood sugar in 15 minutes after treatment (to make sure it is greater than 70 mg/dL). If your blood sugar is not greater than 70 mg/dL on recheck, call [phone] for further instructions. Report your blood sugar to the short stay nurse when you get to Short Stay.  If you are admitted to the hospital after surgery: Your blood sugar will be checked by the staff and you will probably be given insulin after surgery (instead of oral diabetes medicines) to make sure you have good blood sugar levels. The goal for blood sugar control after surgery is 80-180 mg/dL.  [facility] is not responsible for any  belongings or valuables.    Do NOT Smoke (Tobacco/Vaping)  24 hours prior to your procedure  If you use a CPAP at night, you may bring your mask for your overnight stay.   Contacts, glasses, hearing aids, dentures or partials may not be worn into surgery, please bring cases for these belongings   For patients admitted to the hospital, discharge time will be determined by your treatment team.   Patients discharged the day of  surgery will not be allowed to drive home, and someone needs to stay with them for 24 hours.   SURGICAL WAITING ROOM VISITATION Patients having surgery or a procedure may have no more than 2 support people in the waiting area - these visitors may rotate.   Children under the age of 69 must have an adult with them who is not the patient. If the patient needs to stay at the hospital during part of their recovery, the visitor guidelines for inpatient rooms apply. Pre-op nurse will coordinate an appropriate time for 1 support person to accompany patient in pre-op.  This support person may not rotate.   Please refer to [email or web address] for the visitor guidelines for Inpatients (after your surgery is over and you are in a regular room).    Special instructions:    Oral Hygiene is also important to reduce your risk of infection.  Remember - BRUSH YOUR TEETH THE MORNING OF SURGERY WITH YOUR REGULAR TOOTHPASTE   [facility]- Preparing For Surgery  Before surgery, you can play an important role. Because skin is not sterile, your skin needs to be as free of germs as possible. You can reduce the number of germs on your skin by washing with CHG (chlorahexidine gluconate) Soap before surgery.  CHG is an antiseptic cleaner which kills germs and bonds with the skin to continue killing germs even after washing.     Please do not use if you have an allergy to CHG or antibacterial soaps. If your skin becomes reddened/irritated stop using the CHG.  Do not shave (including legs and underarms) for at least 48 hours prior to first CHG shower. It is OK to shave your face.  Please follow these instructions carefully.     Shower the NIGHT BEFORE SURGERY and the MORNING OF SURGERY with CHG Soap.   If you chose to wash your hair, wash your hair first as usual with your normal shampoo. After you shampoo, rinse your hair and body thoroughly to remove the shampoo.   Then [employer] and genitals (private parts) with your normal soap and rinse thoroughly to remove soap.  After that Use CHG Soap as you would any other liquid soap. You can apply CHG directly to the skin and wash gently with a scrungie or a clean washcloth.   Apply the CHG Soap to your body ONLY FROM THE NECK DOWN.  Do not use on open wounds or open sores. Avoid contact with your eyes, ears, mouth and genitals (private parts). Wash Face and genitals (private parts)  with your normal soap.   Wash thoroughly, paying special attention to the area where your surgery will be performed.  Thoroughly rinse your body with warm water from the neck down.  DO NOT shower/wash with your normal soap after using and rinsing off the CHG Soap.  Pat yourself dry with a CLEAN TOWEL.  Wear CLEAN PAJAMAS to bed the night before surgery  Place CLEAN SHEETS on your bed the night before your surgery  DO NOT SLEEP  WITH PETS.   Day of Surgery:  Take a shower with CHG soap. Wear Clean/Comfortable clothing the morning of surgery Do not wear jewelry or makeup. Do not wear lotions, powders, perfumes/cologne or deodorant. Do not shave 48 hours prior to surgery.  Men may shave face and neck. Do not bring valuables to the hospital. Do not wear nail polish, gel polish, artificial nails, or any other type of covering on natural nails (fingers and toes) If you have artificial nails or gel coating that need to be removed by a nail salon, please have this removed prior to surgery. Artificial nails or gel coating may interfere with anesthesia's ability to adequately monitor your vital signs. Remember to brush your teeth WITH YOUR REGULAR TOOTHPASTE.    If you received a COVID test during your pre-op visit, it is requested that you wear a mask when out in public, stay away from anyone that may not be feeling well, and notify your surgeon if you develop symptoms. If you have been in contact with anyone that has tested  positive in the last 10 days, please notify your surgeon.    Please read over the following fact sheets that you were given.

## 2021-11-17 ENCOUNTER — Encounter (HOSPITAL_COMMUNITY): Payer: Self-pay

## 2021-11-17 ENCOUNTER — Other Ambulatory Visit: Payer: Self-pay

## 2021-11-17 ENCOUNTER — Encounter (HOSPITAL_COMMUNITY)
Admission: RE | Admit: 2021-11-17 | Discharge: 2021-11-17 | Disposition: A | Discharge status: Home / Self Care | Payer: PRIVATE HEALTH INSURANCE | Source: Ambulatory Visit | Attending: Obstetrics | Admitting: Obstetrics

## 2021-11-17 VITALS — BP 152/67 | HR 76 | Temp 97.5°F | Resp 17 | Ht 67.0 in | Wt 330.7 lb

## 2021-11-17 DIAGNOSIS — I10 Essential (primary) hypertension: Secondary | ICD-10-CM | POA: Diagnosis not present

## 2021-11-17 DIAGNOSIS — Z01818 Encounter for other preprocedural examination: Secondary | ICD-10-CM | POA: Insufficient documentation

## 2021-11-17 DIAGNOSIS — R7303 Prediabetes: Secondary | ICD-10-CM | POA: Insufficient documentation

## 2021-11-17 DIAGNOSIS — E785 Hyperlipidemia, unspecified: Secondary | ICD-10-CM | POA: Insufficient documentation

## 2021-11-17 DIAGNOSIS — N840 Polyp of corpus uteri: Secondary | ICD-10-CM | POA: Insufficient documentation

## 2021-11-17 HISTORY — DX: Prediabetes: R73.03

## 2021-11-17 LAB — CBC
HCT: 42.8 % (ref 36.0–46.0)
Hemoglobin: 14.2 g/dL (ref 12.0–15.0)
MCH: 29 pg (ref 26.0–34.0)
MCHC: 33.2 g/dL (ref 30.0–36.0)
MCV: 87.5 fL (ref 80.0–100.0)
Platelets: 288 10*3/uL (ref 150–400)
RBC: 4.89 MIL/uL (ref 3.87–5.11)
RDW: 13.1 % (ref 11.5–15.5)
WBC: 9.4 10*3/uL (ref 4.0–10.5)
nRBC: 0 % (ref 0.0–0.2)

## 2021-11-17 LAB — TYPE AND SCREEN
ABO/RH(D): B POS
Antibody Screen: NEGATIVE

## 2021-11-17 LAB — BASIC METABOLIC PANEL
Anion gap: 7 (ref 5–15)
BUN: 5 mg/dL — ABNORMAL LOW (ref 6–20)
CO2: 27 mmol/L (ref 22–32)
Calcium: 9.1 mg/dL (ref 8.9–10.3)
Chloride: 108 mmol/L (ref 98–111)
Creatinine, Ser: 0.83 mg/dL (ref 0.44–1.00)
GFR, Estimated: >60 mL/min (ref >60)
Glucose, Bld: 89 mg/dL (ref 70–99)
Potassium: 3.8 mmol/L (ref 3.5–5.1)
Sodium: 142 mmol/L (ref 135–145)

## 2021-11-17 NOTE — Progress Notes (Addendum)
PCP - Iran Planas PA-C Cardiologist - denies  PPM/ICD - denies  Chest x-ray - n/a EKG - pending Stress Test - denies ECHO - denies Cardiac Cath - denies  Sleep Study - denies CPAP - n/a  PER PATIENT- no dx of diabetes. All previous A1c that this nurse could see did not fall above the prediabetic range. Therefore, I did not get an A1c at PAT appointment.  DO NOT TAKE Semaglutide-Weight Management (WEGOVY) for 7 days prior to surgery. Do not take after October 29.   The day of surgery, do not take other diabetes injectables, including Byetta (exenatide), Bydureon (exenatide ER), Victoza (liraglutide), or Trulicity (dulaglutide).  As of today, STOP taking any Aspirin (unless otherwise instructed by your surgeon) Aleve, Naproxen, Ibuprofen, Motrin, Advil, Goody's, BC's, all herbal medications, fish oil, and all vitamins.  ERAS Protcol - yes PRE-SURGERY Ensure or G2- no  COVID TEST- n/a  Anesthesia review: yes- abnormal EKG  Patient denies shortness of breath, fever, cough and chest pain at PAT appointment  All instructions explained to the patient, with a verbal understanding of the material. Patient agrees to go over the instructions while at home for a better understanding.  The opportunity to ask questions was provided.

## 2021-11-18 NOTE — Progress Notes (Signed)
Anesthesia Chart Review:  Case: [id number] Date/Time: 11/23/21 1030   Procedure: DILATATION & CURETTAGE/HYSTEROSCOPY WITH MYOSURE - Requests 1hr.   Anesthesia type: Choice   Pre-op diagnosis: Endometrial Polyp, Morbid Obesity   Location: [facility] OR ROOM 08 / [place] OR   Surgeons: Aloha Gell, MD       DISCUSSION: Patient is a 41 year old female scheduled for the above procedure.  History includes never smoker, pre-diabetes, HLD, HTN, morbid obesity (s/p laparoscopic gastric sleeve resection 08/26/15), endometrial cancer (s/p D&C 07/07/15, FIGO grade 1, endometroid).  At PAT, she reported being off [facility] for ~ 3 weeks and was advised she should not take within 7 days of surgery. Last A1c seen in from 11/12/16 and was 4.7%, previously 6.1% 02/26/15. Glucose with PAT labs as 89.   Anesthesia team to evaluate on the day of surgery. She is for urine pregnancy test on arrival.    VS: BP (!) 152/67   Pulse 76   Temp (!) 36.4 C   Resp 17   Ht '5\' 7"'$  (1.702 m)   Wt (!) 150 kg   LMP 11/15/2021 (Exact Date)   SpO2 100%   BMI 51.79 kg/m    PROVIDERS: Donella Stade, PA-C is PCP    LABS: Labs reviewed: Acceptable for surgery. (all labs ordered are listed, but only abnormal results are displayed)  Labs Reviewed  BASIC METABOLIC PANEL - Abnormal; Notable for the following components:      Result Value   BUN 5 (*)    All other components within normal limits  CBC  TYPE AND SCREEN     EKG: 11/17/21:  Normal sinus rhythm Possible Anterior infarct , age undetermined - I think EKG appears similar to 01/22/20 tracing.    CV: N/A  Past Medical History:  Diagnosis Date   Abrasion of right eye    irregular 06/07/2015 uses antibiotics, course completed   Arthritis    right knee   Cancer ([place])    endometrial   Diabetes mellitus without complication ([place])    Hyperlipidemia    Hypertension    Obesity, morbid ([place])    Pre-diabetes     Past Surgical History:  Procedure Laterality Date    DILATATION & CURETTAGE/HYSTEROSCOPY WITH MYOSURE N/A 07/07/2015   Procedure: DILATATION & CURETTAGE/HYSTEROSCOPY WITH MYOSURE;  Surgeon: Aloha Gell, MD;  Location: [place] [facility];  Service: Gynecology;  Laterality: N/A;   DILATION AND CURETTAGE OF UTERUS     LAPAROSCOPIC GASTRIC SLEEVE RESECTION N/A 08/26/2015   Procedure: LAPAROSCOPIC GASTRIC SLEEVE RESECTION, UPPER ENDOSCOPY;  Surgeon: Excell Seltzer, MD;  Location: [facility];  Service: General;  Laterality: N/A;   TONSILLECTOMY     WISDOM TOOTH EXTRACTION      MEDICATIONS:  cetirizine (ZYRTEC) 10 MG tablet   cholecalciferol (VITAMIN D3) 25 MCG (1000 UNIT) tablet   cyanocobalamin (,VITAMIN B-12,) 1000 MCG/ML injection   Multiple Vitamin tablet   norethindrone (MICRONOR) 0.35 MG tablet   nystatin-triamcinolone ointment (MYCOLOG)   ondansetron (ZOFRAN) 8 MG tablet   rosuvastatin (CRESTOR) 5 MG tablet   Semaglutide-Weight Management (WEGOVY) 2.4 MG/0.75ML SOAJ   Vitamin D, Ergocalciferol, (DRISDOL) 1.25 MG (50000 UNIT) CAPS capsule   WEGOVY 1 MG/0.5ML SOAJ   No current facility-administered medications for this encounter.    Myra Gianotti, PA-C Surgical Short Stay/Anesthesiology [facility] Phone [phone] [facility] Phone [phone] 11/18/2021 12:06 PM

## 2021-11-18 NOTE — Anesthesia Preprocedure Evaluation (Addendum)
Anesthesia Evaluation  Patient identified by MRN, date of birth, ID band Patient awake    Reviewed: Allergy & Precautions, NPO status , Patient's Chart, lab work & pertinent test results  History of Anesthesia Complications Negative for: history of anesthetic complications  Airway Mallampati: III  TM Distance: >3 FB Neck ROM: Full    Dental  (+) Dental Advisory Given, Teeth Intact   Pulmonary neg pulmonary ROS   breath sounds clear to auscultation       Cardiovascular hypertension,  Rhythm:Regular     Neuro/Psych negative neurological ROS  negative psych ROS   GI/Hepatic negative GI ROS, Neg liver ROS,,,  Endo/Other  diabetes  Lab Results      Component                Value               Date                      HGBA1C                   4.7 (L)             11/12/2016             Renal/GU negative Renal ROS     Musculoskeletal  (+) Arthritis ,    Abdominal   Peds  Hematology negative hematology ROS (+) Lab Results      Component                Value               Date                      WBC                      9.4                 11/17/2021                HGB                      14.2                11/17/2021                HCT                      42.8                11/17/2021                MCV                      87.5                11/17/2021                PLT                      288                 11/17/2021              Anesthesia Other Findings   Reproductive/Obstetrics Lab Results      Component  Value               Date                      PREGTESTUR               NEGATIVE            11/23/2021                                        Anesthesia Physical Anesthesia Plan  ASA: 3  Anesthesia Plan: General   Post-op Pain Management: Ofirmev IV (intra-op)* and Toradol IV (intra-op)*   Induction: Intravenous  PONV Risk Score and Plan: 3 and  Ondansetron and Dexamethasone  Airway Management Planned: LMA and Oral ETT  Additional Equipment: None  Intra-op Plan:   Post-operative Plan: Extubation in OR  Informed Consent: I have reviewed the patients History and Physical, chart, labs and discussed the procedure including the risks, benefits and alternatives for the proposed anesthesia with the patient or authorized representative who has indicated his/her understanding and acceptance.     Dental advisory given  Plan Discussed with: CRNA  Anesthesia Plan Comments: (PAT note written 11/18/2021 by Myra Gianotti, PA-C. )        Anesthesia Quick Evaluation

## 2021-11-23 ENCOUNTER — Ambulatory Visit (HOSPITAL_COMMUNITY): Payer: PRIVATE HEALTH INSURANCE | Admitting: Vascular Surgery

## 2021-11-23 ENCOUNTER — Encounter (HOSPITAL_COMMUNITY)
Admission: RE | Disposition: A | Discharge status: Home / Self Care | Payer: Self-pay | Source: Home / Self Care | Attending: Obstetrics

## 2021-11-23 ENCOUNTER — Ambulatory Visit (HOSPITAL_BASED_OUTPATIENT_CLINIC_OR_DEPARTMENT_OTHER): Payer: PRIVATE HEALTH INSURANCE | Admitting: Anesthesiology

## 2021-11-23 ENCOUNTER — Other Ambulatory Visit: Payer: Self-pay

## 2021-11-23 ENCOUNTER — Ambulatory Visit (HOSPITAL_COMMUNITY)
Admission: RE | Admit: 2021-11-23 | Discharge: 2021-11-23 | Disposition: A | Discharge status: Home / Self Care | Payer: PRIVATE HEALTH INSURANCE | Attending: Obstetrics | Admitting: Obstetrics

## 2021-11-23 ENCOUNTER — Encounter (HOSPITAL_COMMUNITY): Payer: Self-pay | Admitting: Obstetrics

## 2021-11-23 DIAGNOSIS — E282 Polycystic ovarian syndrome: Secondary | ICD-10-CM | POA: Diagnosis not present

## 2021-11-23 DIAGNOSIS — I10 Essential (primary) hypertension: Secondary | ICD-10-CM

## 2021-11-23 DIAGNOSIS — Z8542 Personal history of malignant neoplasm of other parts of uterus: Secondary | ICD-10-CM | POA: Diagnosis not present

## 2021-11-23 DIAGNOSIS — N840 Polyp of corpus uteri: Secondary | ICD-10-CM

## 2021-11-23 DIAGNOSIS — Z793 Long term (current) use of hormonal contraceptives: Secondary | ICD-10-CM | POA: Diagnosis not present

## 2021-11-23 DIAGNOSIS — N8502 Endometrial intraepithelial neoplasia [EIN]: Secondary | ICD-10-CM | POA: Diagnosis not present

## 2021-11-23 DIAGNOSIS — E119 Type 2 diabetes mellitus without complications: Secondary | ICD-10-CM | POA: Diagnosis not present

## 2021-11-23 DIAGNOSIS — M1990 Unspecified osteoarthritis, unspecified site: Secondary | ICD-10-CM | POA: Diagnosis not present

## 2021-11-23 DIAGNOSIS — Z6841 Body Mass Index (BMI) 40.0 and over, adult: Secondary | ICD-10-CM | POA: Diagnosis not present

## 2021-11-23 DIAGNOSIS — N921 Excessive and frequent menstruation with irregular cycle: Secondary | ICD-10-CM | POA: Insufficient documentation

## 2021-11-23 DIAGNOSIS — R7303 Prediabetes: Secondary | ICD-10-CM

## 2021-11-23 HISTORY — PX: DILATATION & CURETTAGE/HYSTEROSCOPY WITH MYOSURE: SHX6511

## 2021-11-23 LAB — GLUCOSE, CAPILLARY: Glucose-Capillary: 94 mg/dL (ref 70–99)

## 2021-11-23 LAB — POCT PREGNANCY, URINE: Preg Test, Ur: NEGATIVE

## 2021-11-23 SURGERY — DILATATION & CURETTAGE/HYSTEROSCOPY WITH MYOSURE
Anesthesia: General

## 2021-11-23 MED ORDER — OXYCODONE HCL 5 MG/5ML PO SOLN: 5.0000 mg | Freq: Once | ORAL | Status: AC | PRN

## 2021-11-23 MED ORDER — OXYCODONE HCL 5 MG PO TABS
5.0000 mg | ORAL_TABLET | Freq: Once | ORAL | Status: AC | PRN
  Administered 2021-11-23: 5 mg via ORAL

## 2021-11-23 MED ORDER — LIDOCAINE 2% (20 MG/ML) 5 ML SYRINGE
INTRAMUSCULAR | Status: AC
  Filled 2021-11-23: qty 5

## 2021-11-23 MED ORDER — PROPOFOL 10 MG/ML IV BOLUS
INTRAVENOUS | Status: AC
  Filled 2021-11-23: qty 20

## 2021-11-23 MED ORDER — FENTANYL CITRATE (PF) 100 MCG/2ML IJ SOLN
INTRAMUSCULAR | Status: DC | PRN
  Administered 2021-11-23: 100 ug via INTRAVENOUS
  Administered 2021-11-23: 50 ug via INTRAVENOUS

## 2021-11-23 MED ORDER — 0.9 % SODIUM CHLORIDE (POUR BTL) OPTIME
TOPICAL | Status: DC | PRN
  Administered 2021-11-23: 1000 mL

## 2021-11-23 MED ORDER — ACETAMINOPHEN 500 MG PO TABS: 1000.0000 mg | ORAL_TABLET | Freq: Once | ORAL | Status: DC | PRN

## 2021-11-23 MED ORDER — ONDANSETRON HCL 4 MG/2ML IJ SOLN
INTRAMUSCULAR | Status: AC
  Filled 2021-11-23: qty 2

## 2021-11-23 MED ORDER — BUPIVACAINE HCL 0.5 % IJ SOLN
INTRAMUSCULAR | Status: DC | PRN
  Administered 2021-11-23: 20 mL

## 2021-11-23 MED ORDER — CHLOROPROCAINE HCL 1 % IJ SOLN
INTRAMUSCULAR | Status: AC
  Filled 2021-11-23: qty 30

## 2021-11-23 MED ORDER — OXYCODONE-ACETAMINOPHEN 5-325 MG PO TABS: 2.0000 | ORAL_TABLET | Freq: Four times a day (QID) | ORAL | 0 refills | Status: DC | PRN

## 2021-11-23 MED ORDER — ORAL CARE MOUTH RINSE: 15.0000 mL | Freq: Once | OROMUCOSAL | Status: AC

## 2021-11-23 MED ORDER — FENTANYL CITRATE (PF) 100 MCG/2ML IJ SOLN
INTRAMUSCULAR | Status: AC
  Filled 2021-11-23: qty 2

## 2021-11-23 MED ORDER — BUPIVACAINE HCL 0.5 % IJ SOLN
INTRAMUSCULAR | Status: AC
  Filled 2021-11-23: qty 1

## 2021-11-23 MED ORDER — ONDANSETRON HCL 4 MG/2ML IJ SOLN
INTRAMUSCULAR | Status: DC | PRN
  Administered 2021-11-23: 4 mg via INTRAVENOUS

## 2021-11-23 MED ORDER — SILVER NITRATE-POT NITRATE 75-25 % EX MISC
CUTANEOUS | Status: AC
  Filled 2021-11-23: qty 10

## 2021-11-23 MED ORDER — PROPOFOL 10 MG/ML IV BOLUS
INTRAVENOUS | Status: DC | PRN
  Administered 2021-11-23: 200 mg via INTRAVENOUS

## 2021-11-23 MED ORDER — POVIDONE-IODINE 10 % EX SWAB
2.0000 | Freq: Once | CUTANEOUS | Status: AC
  Administered 2021-11-23: 2 via TOPICAL

## 2021-11-23 MED ORDER — MIDAZOLAM HCL 2 MG/2ML IJ SOLN
INTRAMUSCULAR | Status: AC
  Filled 2021-11-23: qty 2

## 2021-11-23 MED ORDER — SODIUM CHLORIDE 0.9 % IR SOLN
Status: DC | PRN
  Administered 2021-11-23: 3000 mL

## 2021-11-23 MED ORDER — FENTANYL CITRATE (PF) 100 MCG/2ML IJ SOLN
25.0000 ug | INTRAMUSCULAR | Status: DC | PRN
  Administered 2021-11-23: 50 ug via INTRAVENOUS

## 2021-11-23 MED ORDER — FENTANYL CITRATE (PF) 250 MCG/5ML IJ SOLN
INTRAMUSCULAR | Status: AC
  Filled 2021-11-23: qty 5

## 2021-11-23 MED ORDER — DEXAMETHASONE SODIUM PHOSPHATE 4 MG/ML IJ SOLN
INTRAMUSCULAR | Status: DC | PRN
  Administered 2021-11-23: 8 mg via INTRAVENOUS

## 2021-11-23 MED ORDER — SUCCINYLCHOLINE CHLORIDE 200 MG/10ML IV SOSY
PREFILLED_SYRINGE | INTRAVENOUS | Status: DC | PRN
  Administered 2021-11-23: 160 mg via INTRAVENOUS

## 2021-11-23 MED ORDER — ACETAMINOPHEN 10 MG/ML IV SOLN: 1000.0000 mg | Freq: Once | INTRAVENOUS | Status: DC | PRN

## 2021-11-23 MED ORDER — SODIUM CHLORIDE 0.9 % IV SOLN
100.0000 mg | Freq: Two times a day (BID) | INTRAVENOUS | Status: DC
  Administered 2021-11-23: 100 mg via INTRAVENOUS
  Filled 2021-11-23: qty 100

## 2021-11-23 MED ORDER — IBUPROFEN 600 MG PO TABS: 600.0000 mg | ORAL_TABLET | Freq: Four times a day (QID) | ORAL | 1 refills | Status: DC | PRN

## 2021-11-23 MED ORDER — ACETAMINOPHEN 160 MG/5ML PO SOLN: 1000.0000 mg | Freq: Once | ORAL | Status: DC | PRN

## 2021-11-23 MED ORDER — VASOPRESSIN 20 UNIT/ML IV SOLN
INTRAVENOUS | Status: AC
  Filled 2021-11-23: qty 1

## 2021-11-23 MED ORDER — LACTATED RINGERS IV SOLN: INTRAVENOUS | Status: DC

## 2021-11-23 MED ORDER — ROCURONIUM BROMIDE 10 MG/ML (PF) SYRINGE
PREFILLED_SYRINGE | INTRAVENOUS | Status: AC
  Filled 2021-11-23: qty 10

## 2021-11-23 MED ORDER — CHLORHEXIDINE GLUCONATE 0.12 % MT SOLN
15.0000 mL | Freq: Once | OROMUCOSAL | Status: AC
  Administered 2021-11-23: 15 mL via OROMUCOSAL
  Filled 2021-11-23: qty 15

## 2021-11-23 MED ORDER — LIDOCAINE 2% (20 MG/ML) 5 ML SYRINGE
INTRAMUSCULAR | Status: DC | PRN
  Administered 2021-11-23: 80 mg via INTRAVENOUS

## 2021-11-23 MED ORDER — DEXAMETHASONE SODIUM PHOSPHATE 10 MG/ML IJ SOLN
INTRAMUSCULAR | Status: AC
  Filled 2021-11-23: qty 1

## 2021-11-23 MED ORDER — MIDAZOLAM HCL 5 MG/5ML IJ SOLN
INTRAMUSCULAR | Status: DC | PRN
  Administered 2021-11-23: 2 mg via INTRAVENOUS

## 2021-11-23 MED ORDER — OXYCODONE HCL 5 MG PO TABS
ORAL_TABLET | ORAL | Status: AC
  Filled 2021-11-23: qty 1

## 2021-11-23 SURGICAL SUPPLY — 14 items
CATH ROBINSON RED A/P 16FR (CATHETERS) ×1 IMPLANT
DEVICE MYOSURE LITE (MISCELLANEOUS) IMPLANT
DEVICE MYOSURE REACH (MISCELLANEOUS) IMPLANT
GLOVE BIO SURGEON STRL SZ 6.5 (GLOVE) ×1 IMPLANT
GLOVE SURG UNDER POLY LF SZ7 (GLOVE) ×2 IMPLANT
GOWN STRL REUS W/ TWL LRG LVL3 (GOWN DISPOSABLE) ×2 IMPLANT
GOWN STRL REUS W/TWL LRG LVL3 (GOWN DISPOSABLE) ×2
KIT PROCEDURE FLUENT (KITS) ×1 IMPLANT
KIT TURNOVER KIT B (KITS) ×1 IMPLANT
PACK VAGINAL MINOR WOMEN LF (CUSTOM PROCEDURE TRAY) ×1 IMPLANT
PAD OB MATERNITY 4.3X12.25 (PERSONAL CARE ITEMS) ×1 IMPLANT
SEAL ROD LENS SCOPE MYOSURE (ABLATOR) ×1 IMPLANT
TOWEL GREEN STERILE FF (TOWEL DISPOSABLE) ×2 IMPLANT
UNDERPAD 30X36 HEAVY ABSORB (UNDERPADS AND DIAPERS) ×1 IMPLANT

## 2021-11-23 NOTE — Transfer of Care (Signed)
Immediate Anesthesia Transfer of Care Note  Patient: Valerie Aguilar  Procedure(s) Performed: DILATATION & CURETTAGE/HYSTEROSCOPY WITH MYOSURE  Patient Location: PACU  Anesthesia Type:General  Level of Consciousness: drowsy  Airway & Oxygen Therapy: Patient Spontanous Breathing and Patient connected to face mask oxygen  Post-op Assessment: Report given to RN and Post -op Vital signs reviewed and stable  Post vital signs: Reviewed and stable  Last Vitals:  Vitals Value Taken Time  BP 131/76 11/23/21 1247  Temp    Pulse 80 11/23/21 1248  Resp 0 11/23/21 1248  SpO2 93% 11/23/21 1248  Vitals shown include unvalidated device data.  Last Pain:  Vitals:   11/23/21 0901  TempSrc:   PainSc: 0-No pain         Complications: No notable events documented.

## 2021-11-23 NOTE — H&P (Signed)
Chief complaint: Irregular bleeding and endometrial polyp  41 year old G51 with history of PCOS and prior FIGO grade 1 endometrial adenocarcinoma who presents after greater than 1 year of no care with 1 year of irregular bleeding.  In the past patient had her endometrial adenocarcinoma treated with an IUD and serial biopsies.  Her IUD was removed in 2018 for pregnancy attempts.  Patient did have low AMH and despite active fertility management was never able to conceive.  Once patient abandoned pregnancy attempts in 2020 patient did not resume hormonal management as she remained hopeful for pregnancy.  Patient presented about 6 weeks ago with 1 year history of irregular menses.  And endometrial biopsy which showed fragments of polyp but no hyperplasia.  This was followed by a sonohysterogram which showed a fundal polyp 1.4 x 0.8 x 1.7 cm.  At that time patient was started on a progestin only pill.  Patient notes normal menses about 1 week ago.  Ending 2 days ago.  She remains on the progestin only pill  Past Medical History:  Diagnosis Date   Abrasion of right eye    irregular 06/07/2015 uses antibiotics, course completed   Arthritis    right knee   Cancer ([place])    endometrial   Diabetes mellitus without complication ([place])    Hyperlipidemia    Hypertension    Obesity, morbid ([place])    Pre-diabetes     Past Surgical History:  Procedure Laterality Date   DILATATION & CURETTAGE/HYSTEROSCOPY WITH MYOSURE N/A 07/07/2015   Procedure: DILATATION & CURETTAGE/HYSTEROSCOPY WITH MYOSURE;  Surgeon: Aloha Gell, MD;  Location: [place] [facility];  Service: Gynecology;  Laterality: N/A;   DILATION AND CURETTAGE OF UTERUS     LAPAROSCOPIC GASTRIC SLEEVE RESECTION N/A 08/26/2015   Procedure: LAPAROSCOPIC GASTRIC SLEEVE RESECTION, UPPER ENDOSCOPY;  Surgeon: Excell Seltzer, MD;  Location: [facility];  Service: General;  Laterality: N/A;   TONSILLECTOMY     WISDOM TOOTH EXTRACTION     Allergies to lisinopril and  Lipitor  Physical exam: Vitals:   11/23/21 0842  BP: 124/73  Pulse: 73  Resp: 17  Temp: 98.2 F (36.8 C)  TempSrc: Oral  SpO2: 99%  Weight: (!) 149.7 kg  Height: '5\' 7"'$  (1.702 m)   General: Well-appearing, no distress, obese Abdomen: Obese GU: Deferred to OR Lower extremity: Nontender, no edema  UPT negative Hemoglobin 14.2, platelets 288  Assessment plan: 41 year old G0 with irregular bleeding in the setting of polycystic ovarian syndrome, history of FIGO grade 1 endometrial adenocarcinoma now with ultrasound findings of polyp.  We will plan hysteroscopy with polypectomy and D&C.  Postop I recommend resumption of her IUD.  Patient initially under the impression of IUD placement Intra-Op but given increased risk of expulsion and benefit of ultrasound guidance we will plan IUD placement in the office at the postop visit.  Ala Dach 11/23/2021 11:16 AM

## 2021-11-23 NOTE — Anesthesia Procedure Notes (Signed)
Procedure Name: LMA Insertion Date/Time: 11/23/2021 11:58 AM  Performed by: Lieutenant Diego, CRNAPre-anesthesia Checklist: Patient identified, Emergency Drugs available, Suction available and Patient being monitored Patient Re-evaluated:Patient Re-evaluated prior to induction Oxygen Delivery Method: Circle system utilized Preoxygenation: Pre-oxygenation with 100% oxygen Induction Type: IV induction Ventilation: Mask ventilation without difficulty LMA: LMA inserted LMA Size: 4.0 Number of attempts: 1 Placement Confirmation: positive ETCO2 and breath sounds checked- equal and bilateral Tube secured with: Tape Dental Injury: Teeth and Oropharynx as per pre-operative assessment

## 2021-11-23 NOTE — Anesthesia Procedure Notes (Signed)
Procedure Name: Intubation Date/Time: 11/23/2021 12:01 PM  Performed by: Eulas Post, Needham Biggins W, CRNAPre-anesthesia Checklist: Patient identified, Emergency Drugs available, Suction available and Patient being monitored Patient Re-evaluated:Patient Re-evaluated prior to induction Oxygen Delivery Method: Circle system utilized Preoxygenation: Pre-oxygenation with 100% oxygen Induction Type: IV induction Ventilation: Mask ventilation without difficulty Laryngoscope Size: Miller and 2 Grade View: Grade I Tube type: Oral Tube size: 7.0 mm Number of attempts: 1 Airway Equipment and Method: Stylet Placement Confirmation: ETT inserted through vocal cords under direct vision, positive ETCO2 and breath sounds checked- equal and bilateral Secured at: 22 cm Tube secured with: Tape Dental Injury: Teeth and Oropharynx as per pre-operative assessment

## 2021-11-23 NOTE — Brief Op Note (Signed)
11/23/2021  1:05 PM  PATIENT:  Valerie Aguilar  41 y.o. female  PRE-OPERATIVE DIAGNOSIS:  Endometrial Polyp, Morbid Obesity  POST-OPERATIVE DIAGNOSIS:  Endometrial Polyp, Morbid Obesity  PROCEDURE:  Procedure(s) with comments: DILATATION & CURETTAGE/HYSTEROSCOPY WITH MYOSURE (N/A) - Requests 1hr.  SURGEON:  Surgeon(s) and Role:    * Aloha Gell, MD - Primary  PHYSICIAN ASSISTANT: None  ASSISTANTS: none   ANESTHESIA:   local and general  EBL:  20 mL   BLOOD ADMINISTERED:none  DRAINS: none   LOCAL MEDICATIONS USED:  MARCAINE     SPECIMEN: Endometrial polyp, curettage specimen  DISPOSITION OF SPECIMEN:  PATHOLOGY  COUNTS:  YES  TOURNIQUET:  * No tourniquets in log *  DICTATION: .Note written in EPIC  PLAN OF CARE: Discharge to home after PACU  PATIENT DISPOSITION:  PACU - hemodynamically stable.   Delay start of Pharmacological VTE agent (>24hrs) due to surgical blood loss or risk of bleeding: yes

## 2021-11-23 NOTE — Anesthesia Postprocedure Evaluation (Signed)
Anesthesia Post Note  Patient: Valerie Aguilar  Procedure(s) Performed: DILATATION & CURETTAGE/HYSTEROSCOPY WITH [name]     Patient location during evaluation: PACU Anesthesia Type: General Level of consciousness: awake and alert Pain management: pain level controlled Vital Signs Assessment: post-procedure vital signs reviewed and stable Respiratory status: spontaneous breathing, nonlabored ventilation, respiratory function stable and patient connected to nasal cannula oxygen Cardiovascular status: blood pressure returned to baseline and stable Postop Assessment: no apparent nausea or vomiting Anesthetic complications: no   No notable events documented.  Last Vitals:  Vitals:   11/23/21 1330 11/23/21 1345  BP: 128/81 128/75  Pulse: 67 70  Resp: 10 13  Temp:  36.6 C  SpO2: 92% 99%    Last Pain:  Vitals:   11/23/21 1345  TempSrc:   PainSc: 4                  Justn Quale P Suleiman Finigan

## 2021-11-23 NOTE — Op Note (Signed)
11/23/2021  1:05 PM  PATIENT:  Valerie Aguilar  41 y.o. female  PRE-OPERATIVE DIAGNOSIS:  Endometrial Polyp, Morbid Obesity  POST-OPERATIVE DIAGNOSIS:  Endometrial Polyp, Morbid Obesity  PROCEDURE:  Procedure(s) with comments: DILATATION & CURETTAGE/HYSTEROSCOPY WITH MYOSURE (N/A) - Requests 1hr.  SURGEON:  Surgeon(s) and Role:    * Aloha Gell, MD - Primary  PHYSICIAN ASSISTANT: None  ASSISTANTS: none   ANESTHESIA:   local and general  EBL:  20 mL   BLOOD ADMINISTERED:none  DRAINS: none   LOCAL MEDICATIONS USED:  MARCAINE     SPECIMEN: Endometrial polyp, curettage specimen  DISPOSITION OF SPECIMEN:  PATHOLOGY  COUNTS:  YES  TOURNIQUET:  * No tourniquets in log *  DICTATION: .Note written in EPIC  PLAN OF CARE: Discharge to home after PACU  PATIENT DISPOSITION:  PACU - hemodynamically stable.   Delay start of Pharmacological VTE agent (>24hrs) due to surgical blood loss or risk of bleeding: yes    Abx: '100mg'$  IV doxycycline  Findings: Large endometrial polyp, normal left ostia, difficult visualizing right ostia, increased fluffy hypertrophic tissue in the right cornua, hemostasis post-procedure  Indications: menorrhagia, endometrial polyp, history of FIGO grade 1 endometrial adenocarcinoma   After informed consent including discussion of risks of bleeding, infection, perforation,  the patient was taken to the operating room where general anesthesia was initiated without difficulty. She was prepped and draped in normal sterile fashion in the dorsal supine lithotomy position.  A bimanual examination was done to assess the size and position of the uterus. A speculum was placed in the vagina and single tooth tenaculum used to grasp the anterior lip of the cervix. Local anaesthetic with half percent Marcaine, 20 cc was injected at 5 and 7 o'clock in there cervico-paracervical junction.   The uterus was sounded to 9 cm.  The cervix was then serially dilated to a  #21 Pratt dilator. The hysteroscope was inserted under direct visualization. Survey of the endometrium/ pathology with findings as above.  The Myosure reach blade was then placed through the operating channel, suction was applied and the polyp was serially grasped with the blade and morcellated.  A visual D&C was done under hypertrophic tissue in the right cornual region.  2 small polyps are in this region as well.  A sharp curettage was then done and repeat hysteroscopy revealed no evidence of perforation and no active bleeding.     The hysteroscope was then removed. Tenaculum was removed. The tenaculum site was hemostatic and the case was terminated. The patient tolerated the procedure well. Sponge, lap and needle counts were correct and the patient was taken to the recovery room in stable condition.   Ala Dach 11/23/2021 1:06 PM

## 2021-11-24 ENCOUNTER — Encounter (HOSPITAL_COMMUNITY): Payer: Self-pay | Admitting: Obstetrics

## 2021-11-24 LAB — SURGICAL PATHOLOGY

## 2021-12-04 DIAGNOSIS — Z8542 Personal history of malignant neoplasm of other parts of uterus: Secondary | ICD-10-CM | POA: Insufficient documentation

## 2021-12-04 DIAGNOSIS — N8502 Endometrial intraepithelial neoplasia [EIN]: Secondary | ICD-10-CM | POA: Insufficient documentation

## 2021-12-30 ENCOUNTER — Encounter: Payer: Self-pay | Admitting: Physician Assistant

## 2022-03-05 ENCOUNTER — Ambulatory Visit (INDEPENDENT_AMBULATORY_CARE_PROVIDER_SITE_OTHER): Payer: PRIVATE HEALTH INSURANCE | Admitting: Physician Assistant

## 2022-03-05 ENCOUNTER — Encounter: Payer: Self-pay | Admitting: Physician Assistant

## 2022-03-05 VITALS — BP 132/68 | HR 83 | Ht 67.0 in | Wt 363.0 lb

## 2022-03-05 DIAGNOSIS — E782 Mixed hyperlipidemia: Secondary | ICD-10-CM | POA: Diagnosis not present

## 2022-03-05 DIAGNOSIS — D510 Vitamin B12 deficiency anemia due to intrinsic factor deficiency: Secondary | ICD-10-CM

## 2022-03-05 DIAGNOSIS — E559 Vitamin D deficiency, unspecified: Secondary | ICD-10-CM | POA: Diagnosis not present

## 2022-03-05 DIAGNOSIS — R7303 Prediabetes: Secondary | ICD-10-CM

## 2022-03-05 DIAGNOSIS — Z1329 Encounter for screening for other suspected endocrine disorder: Secondary | ICD-10-CM

## 2022-03-05 DIAGNOSIS — E538 Deficiency of other specified B group vitamins: Secondary | ICD-10-CM

## 2022-03-05 MED ORDER — WEGOVY 1 MG/0.5ML ~~LOC~~ SOAJ: 1.0000 mg | SUBCUTANEOUS | 0 refills | Status: AC

## 2022-03-05 NOTE — Progress Notes (Unsigned)
Established Patient Office Visit  Subjective   Patient ID: Valerie Aguilar, female    DOB: [birth year]  Age: 42 y.o. MRN: [id number]  Chief Complaint  Patient presents with   Follow-up    HPI Pt is a 42 yo morbidly obese female with hx of sleeve gastrectomy, pre-diabetes, Endometrial adenocarcinoma, HLD who presents to the clinic to restart wegovy. She had lost quite a bit of weight on wegovy but had to stop due to her D and C procedure. She has gained 30lbs in 6 months. She has no energy and history of b12 deficiency.   .. 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   B12 deficiency 11/29/2016   Chronic pain of left knee 04/14/2020   Chronic left shoulder pain 04/14/2020   Elevated fasting glucose 04/15/2020   Class 3 severe obesity due to excess calories without serious comorbidity with body mass index (BMI) of 50.0 to 59.9 in adult ([place]) 05/20/2020   Elevated blood pressure reading 05/20/2020   Nausea 08/24/2020   Mixed hyperlipidemia 02/27/2021   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    Objective:     BP 132/68   Pulse 83   Ht 5' 7"$  (1.702 m)   Wt (!) 363 lb 0.6 oz  (164.7 kg)   SpO2 96%   BMI 56.86 kg/m  BP Readings from Last 3 Encounters:  03/05/22 132/68  11/23/21 128/75  11/17/21 (!) 152/67   Wt Readings from Last 3 Encounters:  03/05/22 (!) 363 lb 0.6 oz (164.7 kg)  11/23/21 (!) 330 lb (149.7 kg)  11/17/21 (!) 330 lb 11.2 oz (150 kg)      Physical Exam Constitutional:      Appearance: Normal appearance. She is obese.  Cardiovascular:     Rate and Rhythm: Normal rate and regular rhythm.  Pulmonary:     Effort: Pulmonary effort is normal.     Breath sounds: Normal breath sounds.  Neurological:     General: No focal deficit present.     Mental Status: She is alert and oriented to person, place, and time.  Psychiatric:        Mood and Affect: Mood normal.          Assessment & Plan:  .Marland KitchenDollene was seen today for follow-up.  Diagnoses and all orders for this visit:  Class 3 severe obesity due to excess calories without serious comorbidity with body mass index (BMI) of 50.0 to 59.9 in adult (HCC) -     WEGOVY 1 MG/0.5ML SOAJ; Inject 1 mg into the skin once a week. Use this dose for 1 month (4 shots) and then increase  to next higher dose.  Mixed hyperlipidemia -     Lipid Panel w/reflex Direct LDL -     WEGOVY 1 MG/0.5ML SOAJ; Inject 1 mg into the skin once a week. Use this dose for 1 month (4 shots) and then increase to next higher dose.  Vitamin D deficiency -     Vitamin D (25 hydroxy)  B12 deficiency -     Vitamin B12  Pernicious anemia -     CBC with Differential/Platelet  Pre-diabetes -     COMPLETE METABOLIC PANEL WITH GFR -     Hemoglobin A1c -     WEGOVY 1 MG/0.5ML SOAJ; Inject 1 mg into the skin once a week. Use this dose for 1 month (4 shots) and then increase to next higher dose.  Thyroid disorder screen -     TSH   Trouble finding low doses of wegovy Gave sample of ozempic to get her to 36m of wegovy Labs ordered Follow up in 6 months for weight loss  ..Marland Kitcheniscussed low carb diet with 1500 calories  and 80g of protein.  Exercising at least 150 minutes a week.  My Fitness Pal could be a gMicrobiologist     JIran Planas PA-C

## 2022-03-06 LAB — COMPLETE METABOLIC PANEL WITH GFR
AG Ratio: 1.5 (calc) (ref 1.0–2.5)
ALT: 23 U/L (ref 6–29)
AST: 20 U/L (ref 10–30)
Albumin: 3.8 g/dL (ref 3.6–5.1)
Alkaline phosphatase (APISO): 79 U/L (ref 31–125)
BUN: 10 mg/dL (ref 7–25)
CO2: 25 mmol/L (ref 20–32)
Calcium: 9.1 mg/dL (ref 8.6–10.2)
Chloride: 103 mmol/L (ref 98–110)
Creat: 0.62 mg/dL (ref 0.50–0.97)
Globulin: 2.5 g/dL (calc) (ref 1.9–3.7)
Glucose, Bld: 84 mg/dL (ref 65–99)
Potassium: 3.9 mmol/L (ref 3.5–5.3)
Sodium: 139 mmol/L (ref 135–146)
Total Bilirubin: 0.9 mg/dL (ref 0.2–1.2)
Total Protein: 6.3 g/dL (ref 6.1–8.1)
eGFR: 116 mL/min/{1.73_m2} (ref >=60)

## 2022-03-06 LAB — CBC WITH DIFFERENTIAL/PLATELET
Absolute Monocytes: 405 cells/uL (ref 200–950)
Basophils Absolute: 18 cells/uL (ref 0–200)
Basophils Relative: 0.2 %
Eosinophils Absolute: 72 cells/uL (ref 15–500)
Eosinophils Relative: 0.8 %
HCT: 41.7 % (ref 35.0–45.0)
Hemoglobin: 14 g/dL (ref 11.7–15.5)
Lymphs Abs: 2412 cells/uL (ref 850–3900)
MCH: 28.2 pg (ref 27.0–33.0)
MCHC: 33.6 g/dL (ref 32.0–36.0)
MCV: 83.9 fL (ref 80.0–100.0)
MPV: 12.4 fL (ref 7.5–12.5)
Monocytes Relative: 4.5 %
Neutro Abs: 6093 cells/uL (ref 1500–7800)
Neutrophils Relative %: 67.7 %
Platelets: 194 10*3/uL (ref 140–400)
RBC: 4.97 10*6/uL (ref 3.80–5.10)
RDW: 12.5 % (ref 11.0–15.0)
Total Lymphocyte: 26.8 %
WBC: 9 10*3/uL (ref 3.8–10.8)

## 2022-03-06 LAB — HEMOGLOBIN A1C
Hgb A1c MFr Bld: 5.8 % of total Hgb — ABNORMAL HIGH (ref <5.7)
Mean Plasma Glucose: 120 mg/dL
eAG (mmol/L): 6.6 mmol/L

## 2022-03-06 LAB — LIPID PANEL W/REFLEX DIRECT LDL
Cholesterol: 153 mg/dL (ref <200)
HDL: 42 mg/dL — ABNORMAL LOW (ref >=50)
LDL Cholesterol (Calc): 90 mg/dL (calc)
Non-HDL Cholesterol (Calc): 111 mg/dL (calc) (ref <130)
Total CHOL/HDL Ratio: 3.6 (calc) (ref <5.0)
Triglycerides: 116 mg/dL (ref <150)

## 2022-03-06 LAB — VITAMIN B12: Vitamin B-12: 338 pg/mL (ref 200–1100)

## 2022-03-06 LAB — VITAMIN D 25 HYDROXY (VIT D DEFICIENCY, FRACTURES): Vit D, 25-Hydroxy: 70 ng/mL (ref 30–100)

## 2022-03-06 LAB — TSH: TSH: 2.55 mIU/L

## 2022-03-08 ENCOUNTER — Encounter: Payer: Self-pay | Admitting: Physician Assistant

## 2022-03-08 NOTE — Progress Notes (Signed)
Valerie Aguilar,   Cholesterol looks pretty good.  LDL under 100 and HDL increased some.  A1C up quite a bit to pre-diabetes range.  Thyroid looks good.  Kidney, liver, glucose looks great.  Vitamin D looks great.  B12 low side of normal. Make sure taking b12 1058mg daily.

## 2022-03-08 NOTE — Telephone Encounter (Signed)
Attempted call to patient. Left voice mail message requesting a return call.

## 2022-03-08 NOTE — Telephone Encounter (Signed)
She should definitely finish out the full 4 weeks of 0.5 mg.  Was she told to just skip straight to 1 mg?  Not sure.

## 2022-03-09 ENCOUNTER — Encounter: Payer: Self-pay | Admitting: Physician Assistant

## 2022-03-15 ENCOUNTER — Telehealth: Payer: Self-pay

## 2022-03-15 NOTE — Telephone Encounter (Signed)
Initiated Prior authorization [id number] '1MG'$ /0.5ML auto-injectors Via: Covermymeds Case/Key:[id number] Status: approved as of 03/15/22 Reason:Authorization Expiration Date: September 03, 2022. Notified Pt via: Mychart

## 2022-04-12 ENCOUNTER — Encounter: Payer: Self-pay | Admitting: Physician Assistant

## 2022-04-12 MED ORDER — SEMAGLUTIDE-WEIGHT MANAGEMENT 1.7 MG/0.75ML ~~LOC~~ SOAJ: 1.7000 mg | SUBCUTANEOUS | 0 refills | Status: AC

## 2022-04-12 MED ORDER — SEMAGLUTIDE-WEIGHT MANAGEMENT 2.4 MG/0.75ML ~~LOC~~ SOAJ: 2.4000 mg | SUBCUTANEOUS | 0 refills | Status: AC

## 2022-04-27 ENCOUNTER — Encounter: Payer: Self-pay | Admitting: Physician Assistant

## 2022-04-27 MED ORDER — ONDANSETRON 8 MG PO TBDP: 8.0000 mg | ORAL_TABLET | Freq: Three times a day (TID) | ORAL | 1 refills | Status: DC | PRN

## 2022-08-23 ENCOUNTER — Other Ambulatory Visit: Payer: Self-pay | Admitting: Physician Assistant

## 2022-09-03 ENCOUNTER — Ambulatory Visit: Payer: PRIVATE HEALTH INSURANCE | Admitting: Physician Assistant
